# Patient Record
Sex: Male | Born: 1965
Health system: Southern US, Community
[De-identification: ages and names within clinical notes are randomized; demographics above are authoritative.]

## PROBLEM LIST (undated history)

## (undated) DIAGNOSIS — G4700 Insomnia, unspecified: Secondary | ICD-10-CM

## (undated) DIAGNOSIS — F32A Depression, unspecified: Secondary | ICD-10-CM

## (undated) DIAGNOSIS — N189 Chronic kidney disease, unspecified: Secondary | ICD-10-CM

## (undated) DIAGNOSIS — J449 Chronic obstructive pulmonary disease, unspecified: Secondary | ICD-10-CM

## (undated) DIAGNOSIS — I2510 Atherosclerotic heart disease of native coronary artery without angina pectoris: Secondary | ICD-10-CM

## (undated) DIAGNOSIS — F419 Anxiety disorder, unspecified: Secondary | ICD-10-CM

## (undated) DIAGNOSIS — F329 Major depressive disorder, single episode, unspecified: Secondary | ICD-10-CM

## (undated) DIAGNOSIS — E7800 Pure hypercholesterolemia, unspecified: Secondary | ICD-10-CM

## (undated) DIAGNOSIS — B356 Tinea cruris: Secondary | ICD-10-CM

## (undated) HISTORY — DX: Pure hypercholesterolemia, unspecified: E78.00

## (undated) HISTORY — DX: Insomnia, unspecified: G47.00

## (undated) HISTORY — DX: Tinea cruris: B35.6

## (undated) HISTORY — DX: Atherosclerotic heart disease of native coronary artery without angina pectoris: I25.10

## (undated) HISTORY — PX: REPAIR ANKLE LIGAMENT: SUR1187

## (undated) HISTORY — DX: Chronic obstructive pulmonary disease, unspecified: J44.9

## (undated) HISTORY — PX: INCISION AND DRAINAGE ABSCESS / HEMATOMA OF BURSA / KNEE / THIGH: SUR668

## (undated) HISTORY — DX: Chronic kidney disease, unspecified: N18.9

---

## 2008-04-19 ENCOUNTER — Encounter (INDEPENDENT_AMBULATORY_CARE_PROVIDER_SITE_OTHER): Payer: Self-pay | Admitting: General Surgery

## 2008-04-19 ENCOUNTER — Other Ambulatory Visit: Admission: RE | Admit: 2008-04-19 | Discharge: 2008-04-19 | Payer: Self-pay | Admitting: General Surgery

## 2009-08-14 ENCOUNTER — Ambulatory Visit (HOSPITAL_COMMUNITY): Admission: RE | Admit: 2009-08-14 | Discharge: 2009-08-14 | Payer: Self-pay | Admitting: Family Medicine

## 2011-04-03 ENCOUNTER — Other Ambulatory Visit (HOSPITAL_COMMUNITY): Payer: Self-pay | Admitting: Family Medicine

## 2011-04-03 ENCOUNTER — Ambulatory Visit (HOSPITAL_COMMUNITY)
Admission: RE | Admit: 2011-04-03 | Discharge: 2011-04-03 | Disposition: A | Discharge status: Home / Self Care | Payer: PRIVATE HEALTH INSURANCE | Source: Ambulatory Visit | Attending: Family Medicine | Admitting: Family Medicine

## 2011-04-03 DIAGNOSIS — F17200 Nicotine dependence, unspecified, uncomplicated: Secondary | ICD-10-CM | POA: Insufficient documentation

## 2011-04-03 DIAGNOSIS — R059 Cough, unspecified: Secondary | ICD-10-CM | POA: Insufficient documentation

## 2011-04-03 DIAGNOSIS — R05 Cough: Secondary | ICD-10-CM | POA: Insufficient documentation

## 2011-04-03 DIAGNOSIS — J438 Other emphysema: Secondary | ICD-10-CM | POA: Insufficient documentation

## 2012-02-19 ENCOUNTER — Other Ambulatory Visit (HOSPITAL_COMMUNITY): Payer: Self-pay | Admitting: Family Medicine

## 2012-02-19 ENCOUNTER — Ambulatory Visit (HOSPITAL_COMMUNITY)
Admission: RE | Admit: 2012-02-19 | Discharge: 2012-02-19 | Disposition: A | Discharge status: Home / Self Care | Payer: Commercial Managed Care - PPO | Source: Ambulatory Visit | Attending: Family Medicine | Admitting: Family Medicine

## 2012-02-19 DIAGNOSIS — M5030 Other cervical disc degeneration, unspecified cervical region: Secondary | ICD-10-CM | POA: Insufficient documentation

## 2012-02-19 DIAGNOSIS — M542 Cervicalgia: Secondary | ICD-10-CM | POA: Insufficient documentation

## 2012-02-19 DIAGNOSIS — R52 Pain, unspecified: Secondary | ICD-10-CM

## 2014-02-27 ENCOUNTER — Other Ambulatory Visit (HOSPITAL_COMMUNITY): Payer: Self-pay | Admitting: Family Medicine

## 2014-02-27 ENCOUNTER — Ambulatory Visit (HOSPITAL_COMMUNITY)
Admission: RE | Admit: 2014-02-27 | Discharge: 2014-02-27 | Disposition: A | Discharge status: Home / Self Care | Payer: Commercial Managed Care - PPO | Source: Ambulatory Visit | Attending: Family Medicine | Admitting: Family Medicine

## 2014-02-27 DIAGNOSIS — M545 Low back pain, unspecified: Secondary | ICD-10-CM

## 2014-02-27 DIAGNOSIS — M542 Cervicalgia: Secondary | ICD-10-CM | POA: Diagnosis present

## 2014-02-27 DIAGNOSIS — M5030 Other cervical disc degeneration, unspecified cervical region: Secondary | ICD-10-CM | POA: Insufficient documentation

## 2014-03-09 ENCOUNTER — Other Ambulatory Visit (HOSPITAL_COMMUNITY): Payer: Self-pay | Admitting: Family Medicine

## 2014-03-09 DIAGNOSIS — M542 Cervicalgia: Secondary | ICD-10-CM

## 2014-03-16 ENCOUNTER — Ambulatory Visit (HOSPITAL_COMMUNITY)
Admission: RE | Admit: 2014-03-16 | Discharge: 2014-03-16 | Disposition: A | Discharge status: Home / Self Care | Payer: Commercial Managed Care - PPO | Source: Ambulatory Visit | Attending: Family Medicine | Admitting: Family Medicine

## 2014-03-16 ENCOUNTER — Other Ambulatory Visit (HOSPITAL_COMMUNITY): Payer: Self-pay | Admitting: Family Medicine

## 2014-03-16 DIAGNOSIS — M542 Cervicalgia: Secondary | ICD-10-CM

## 2014-03-16 DIAGNOSIS — M79602 Pain in left arm: Secondary | ICD-10-CM

## 2014-03-16 DIAGNOSIS — M5020 Other cervical disc displacement, unspecified cervical region: Secondary | ICD-10-CM | POA: Insufficient documentation

## 2014-03-16 DIAGNOSIS — M47812 Spondylosis without myelopathy or radiculopathy, cervical region: Secondary | ICD-10-CM | POA: Insufficient documentation

## 2014-03-16 DIAGNOSIS — M4802 Spinal stenosis, cervical region: Secondary | ICD-10-CM | POA: Insufficient documentation

## 2014-03-30 ENCOUNTER — Other Ambulatory Visit: Payer: Self-pay | Admitting: Family Medicine

## 2014-03-30 DIAGNOSIS — M542 Cervicalgia: Secondary | ICD-10-CM

## 2014-04-03 ENCOUNTER — Ambulatory Visit
Admission: RE | Admit: 2014-04-03 | Discharge: 2014-04-03 | Disposition: A | Discharge status: Home / Self Care | Payer: Commercial Managed Care - PPO | Source: Ambulatory Visit | Attending: Family Medicine | Admitting: Family Medicine

## 2014-04-03 VITALS — BP 137/82 | HR 83

## 2014-04-03 DIAGNOSIS — M5020 Other cervical disc displacement, unspecified cervical region: Secondary | ICD-10-CM

## 2014-04-03 DIAGNOSIS — M542 Cervicalgia: Secondary | ICD-10-CM

## 2014-04-03 MED ORDER — TRIAMCINOLONE ACETONIDE 40 MG/ML IJ SUSP (RADIOLOGY)
60.0000 mg | Freq: Once | INTRAMUSCULAR | Status: AC
  Administered 2014-04-03: 60 mg via EPIDURAL

## 2014-04-03 MED ORDER — IOHEXOL 300 MG/ML  SOLN
1.0000 mL | Freq: Once | INTRAMUSCULAR | Status: AC | PRN
  Administered 2014-04-03: 1 mL via EPIDURAL

## 2014-04-03 NOTE — Discharge Instructions (Signed)

## 2014-08-16 ENCOUNTER — Other Ambulatory Visit: Payer: Self-pay | Admitting: Family Medicine

## 2014-08-16 DIAGNOSIS — M4802 Spinal stenosis, cervical region: Secondary | ICD-10-CM

## 2014-08-30 ENCOUNTER — Ambulatory Visit
Admission: RE | Admit: 2014-08-30 | Discharge: 2014-08-30 | Disposition: A | Discharge status: Home / Self Care | Payer: Commercial Managed Care - PPO | Source: Ambulatory Visit | Attending: Family Medicine | Admitting: Family Medicine

## 2014-08-30 DIAGNOSIS — M4802 Spinal stenosis, cervical region: Secondary | ICD-10-CM

## 2014-08-30 MED ORDER — IOHEXOL 300 MG/ML  SOLN
1.0000 mL | Freq: Once | INTRAMUSCULAR | Status: AC | PRN
  Administered 2014-08-30: 1 mL via EPIDURAL

## 2014-08-30 MED ORDER — TRIAMCINOLONE ACETONIDE 40 MG/ML IJ SUSP (RADIOLOGY)
60.0000 mg | Freq: Once | INTRAMUSCULAR | Status: AC
  Administered 2014-08-30: 60 mg via EPIDURAL

## 2015-01-15 ENCOUNTER — Other Ambulatory Visit: Payer: Self-pay | Admitting: Family Medicine

## 2015-01-15 DIAGNOSIS — M4802 Spinal stenosis, cervical region: Secondary | ICD-10-CM

## 2015-01-23 ENCOUNTER — Ambulatory Visit
Admission: RE | Admit: 2015-01-23 | Discharge: 2015-01-23 | Disposition: A | Discharge status: Home / Self Care | Payer: Commercial Managed Care - PPO | Source: Ambulatory Visit | Attending: Family Medicine | Admitting: Family Medicine

## 2015-01-23 DIAGNOSIS — M4802 Spinal stenosis, cervical region: Secondary | ICD-10-CM

## 2015-01-23 MED ORDER — TRIAMCINOLONE ACETONIDE 40 MG/ML IJ SUSP (RADIOLOGY)
60.0000 mg | Freq: Once | INTRAMUSCULAR | Status: AC
  Administered 2015-01-23: 60 mg via EPIDURAL

## 2015-01-23 MED ORDER — IOHEXOL 300 MG/ML  SOLN
1.0000 mL | Freq: Once | INTRAMUSCULAR | Status: AC | PRN
  Administered 2015-01-23: 1 mL via EPIDURAL

## 2015-01-23 NOTE — Discharge Instructions (Signed)

## 2015-03-04 ENCOUNTER — Other Ambulatory Visit (HOSPITAL_COMMUNITY): Payer: Self-pay | Admitting: Respiratory Therapy

## 2015-03-04 DIAGNOSIS — J441 Chronic obstructive pulmonary disease with (acute) exacerbation: Secondary | ICD-10-CM

## 2015-03-06 ENCOUNTER — Ambulatory Visit (HOSPITAL_COMMUNITY): Admission: RE | Admit: 2015-03-06 | Payer: Commercial Managed Care - PPO | Source: Ambulatory Visit

## 2015-03-13 ENCOUNTER — Other Ambulatory Visit (HOSPITAL_COMMUNITY): Payer: Self-pay | Admitting: Respiratory Therapy

## 2015-03-18 ENCOUNTER — Other Ambulatory Visit: Payer: Self-pay | Admitting: Neurosurgery

## 2015-03-18 DIAGNOSIS — M5416 Radiculopathy, lumbar region: Secondary | ICD-10-CM

## 2015-03-18 DIAGNOSIS — M5412 Radiculopathy, cervical region: Secondary | ICD-10-CM

## 2015-04-04 ENCOUNTER — Ambulatory Visit
Admission: RE | Admit: 2015-04-04 | Discharge: 2015-04-04 | Disposition: A | Discharge status: Home / Self Care | Payer: Commercial Managed Care - PPO | Source: Ambulatory Visit | Attending: Neurosurgery | Admitting: Neurosurgery

## 2015-04-04 VITALS — BP 101/53 | HR 84

## 2015-04-04 DIAGNOSIS — M5416 Radiculopathy, lumbar region: Secondary | ICD-10-CM

## 2015-04-04 DIAGNOSIS — M5412 Radiculopathy, cervical region: Secondary | ICD-10-CM

## 2015-04-04 MED ORDER — DIAZEPAM 5 MG PO TABS
10.0000 mg | ORAL_TABLET | Freq: Once | ORAL | Status: AC
Start: 2015-04-04 — End: 2015-04-04
  Administered 2015-04-04: 10 mg via ORAL

## 2015-04-04 MED ORDER — IOHEXOL 300 MG/ML  SOLN
10.0000 mL | Freq: Once | INTRAMUSCULAR | Status: DC | PRN
  Administered 2015-04-04: 10 mL via INTRATHECAL

## 2015-04-04 NOTE — Discharge Instructions (Signed)

## 2015-04-05 ENCOUNTER — Ambulatory Visit (HOSPITAL_COMMUNITY)
Admission: RE | Admit: 2015-04-05 | Discharge: 2015-04-05 | Disposition: A | Discharge status: Home / Self Care | Payer: Commercial Managed Care - PPO | Source: Ambulatory Visit | Attending: Internal Medicine | Admitting: Internal Medicine

## 2015-04-05 DIAGNOSIS — J441 Chronic obstructive pulmonary disease with (acute) exacerbation: Secondary | ICD-10-CM | POA: Insufficient documentation

## 2015-04-05 MED ORDER — ALBUTEROL SULFATE (2.5 MG/3ML) 0.083% IN NEBU
2.5000 mg | INHALATION_SOLUTION | Freq: Once | RESPIRATORY_TRACT | Status: AC
  Administered 2015-04-05: 2.5 mg via RESPIRATORY_TRACT

## 2015-04-10 LAB — PULMONARY FUNCTION TEST
DL/VA % PRED: 88 %
DL/VA: 4.22 ml/min/mmHg/L
DLCO unc % pred: 70 %
DLCO unc: 24.59 ml/min/mmHg
FEF 25-75 POST: 2.38 L/s
FEF 25-75 Pre: 1.45 L/sec
FEF2575-%CHANGE-POST: 63 %
FEF2575-%PRED-POST: 65 %
FEF2575-%PRED-PRE: 40 %
FEV1-%CHANGE-POST: 15 %
FEV1-%PRED-PRE: 56 %
FEV1-%Pred-Post: 64 %
FEV1-PRE: 2.34 L
FEV1-Post: 2.7 L
FEV1FVC-%CHANGE-POST: 0 %
FEV1FVC-%PRED-PRE: 87 %
FEV6-%Change-Post: 14 %
FEV6-%Pred-Post: 75 %
FEV6-%Pred-Pre: 65 %
FEV6-POST: 3.9 L
FEV6-Pre: 3.41 L
FEV6FVC-%Change-Post: 0 %
FEV6FVC-%PRED-POST: 103 %
FEV6FVC-%Pred-Pre: 103 %
FVC-%CHANGE-POST: 14 %
FVC-%PRED-POST: 73 %
FVC-%PRED-PRE: 63 %
FVC-POST: 3.92 L
FVC-PRE: 3.43 L
PRE FEV6/FVC RATIO: 100 %
Post FEV1/FVC ratio: 69 %
Post FEV6/FVC ratio: 99 %
Pre FEV1/FVC ratio: 68 %
RV % pred: 213 %
RV: 4.64 L
TLC % PRED: 111 %
TLC: 8.23 L

## 2015-10-02 ENCOUNTER — Telehealth: Payer: Self-pay

## 2015-10-02 NOTE — Telephone Encounter (Signed)
[phone]  PATIENT RECEIVED LETTER TO SCHEDULE TCS

## 2015-10-07 NOTE — Telephone Encounter (Signed)
I called pt and he said he has some spotting of blood on tissue sometimes and he also has intermittent rectal pain which gets very uncomfortable.  Ov with Wynne DustEric Gill, NP on 10/24/2015 at 10:30 AM.

## 2015-10-24 ENCOUNTER — Encounter: Payer: Self-pay | Admitting: Nurse Practitioner

## 2015-10-24 ENCOUNTER — Ambulatory Visit (INDEPENDENT_AMBULATORY_CARE_PROVIDER_SITE_OTHER): Payer: Commercial Managed Care - PPO | Admitting: Nurse Practitioner

## 2015-10-24 ENCOUNTER — Other Ambulatory Visit: Payer: Self-pay

## 2015-10-24 VITALS — BP 123/80 | HR 82 | Temp 97.9°F | Ht 66.0 in | Wt 210.2 lb

## 2015-10-24 DIAGNOSIS — K6289 Other specified diseases of anus and rectum: Secondary | ICD-10-CM | POA: Diagnosis not present

## 2015-10-24 DIAGNOSIS — Z1211 Encounter for screening for malignant neoplasm of colon: Secondary | ICD-10-CM | POA: Diagnosis not present

## 2015-10-24 DIAGNOSIS — K625 Hemorrhage of anus and rectum: Secondary | ICD-10-CM

## 2015-10-24 MED ORDER — PEG 3350-KCL-NA BICARB-NACL 420 G PO SOLR: 4000.0000 mL | Freq: Once | ORAL | Status: DC

## 2015-10-24 NOTE — Assessment & Plan Note (Signed)
Patient with rectal burning and irritation associated with scant toilet tissue hematochezia as per above. Tried his jock itch steroid cream one time which helped. At this point I will send an Anusol to the pharmacy for him to use as needed for what is likely hemorrhoids. First-ever screening colonoscopy as noted below.

## 2015-10-24 NOTE — Progress Notes (Signed)
cc'ed to pcp °

## 2015-10-24 NOTE — Patient Instructions (Signed)
1. We will schedule your procedure for you. 2. We will use stronger sleepy medicine to ensure you're comfortable during the procedure. 3. Return for follow-up based on postprocedure recommendations.
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
## 2015-10-24 NOTE — Assessment & Plan Note (Addendum)
Patient is due for first-ever colonoscopy which will also allow evaluation of cause of rectal bleeding and pain as noted above. Likely due to hemorrhoids, may be a candidate for hemorrhoid banding. At this point he is wanting to try medication for his hemorrhoids as irregular problem. We will proceed with his colonoscopy at this time.  Proceed with colonoscopy in the OR on propofol/MAC with Dr. Darrick PennaFields in the near future. The risks, benefits, and alternatives have been discussed in detail with the patient. They state understanding and desire to proceed.   Patient is currently on hydrocodone as needed, Flexeril, Xanax, Celebrex. This point we will proceed with the procedure and the OR on propofol/MAC to promote adequate sedation.

## 2015-10-24 NOTE — Assessment & Plan Note (Signed)
Patient with rare scant toilet tissue hematochezia associated with rectal irritation and burning. Likely due to hemorrhoids. Has not had an issue in several months. Continue to monitor, set up first-ever screening colonoscopy as he is currently due, as per below.

## 2015-10-28 ENCOUNTER — Telehealth: Payer: Self-pay

## 2015-10-28 DIAGNOSIS — K649 Unspecified hemorrhoids: Secondary | ICD-10-CM

## 2015-10-28 MED ORDER — HYDROCORTISONE 2.5 % RE CREA: 1.0000 "application " | TOPICAL_CREAM | Freq: Two times a day (BID) | RECTAL | Status: DC

## 2015-10-28 NOTE — Telephone Encounter (Signed)
It appears I forgot. My apologies. I sent it in now. Anusol rectal cream, twice daily for up to 10 days at a time.  Please notify the patient.

## 2015-10-28 NOTE — Telephone Encounter (Signed)
Pt called to see if EG had called in the pain medication that he said at the office visit. Please advise

## 2015-10-28 NOTE — Telephone Encounter (Signed)
PT IS AWARE

## 2015-11-20 NOTE — Patient Instructions (Signed)
Bobby Gonzales  11/20/2015     @   Your procedure is scheduled on 11/26/2015.  Report to [name] at 8:30 A.M.  Call this number if you have problems the morning of surgery:  [phone]   Remember:  Do not eat food or drink liquids after midnight.  Take these medicines the morning of surgery with A SIP OF WATER XANAX, FLEXERIL, VICODIN AND PRILOSEC.  PLEASE TAKE YOUR ADVAIR AND ANORO ELLIPA BEFORE LEAVING HOME AND BRING IT WITH YOU TO THE HOSPITAL.     Do not wear jewelry, make-up or nail polish.  Do not wear lotions, powders, or perfumes.  You may wear deodorant.  Do not shave 48 hours prior to surgery.  Men may shave face and neck.  Do not bring valuables to the hospital.  [facility] is not responsible for any belongings or valuables.  Contacts, dentures or bridgework may not be worn into surgery.  Leave your suitcase in the car.  After surgery it may be brought to your room.  For patients admitted to the hospital, discharge time will be determined by your treatment team.  Patients discharged the day of surgery will not be allowed to drive home.   Name and phone number of your driver:   FAMILY Special instructions:  N/A  Please read over the following fact sheets that you were given. Care and Recovery After Surgery   PATIENT INSTRUCTIONS POST-ANESTHESIA  IMMEDIATELY FOLLOWING SURGERY:  Do not drive or operate machinery for the first twenty four hours after surgery.  Do not make any important decisions for twenty four hours after surgery or while taking narcotic pain medications or sedatives.  If you develop intractable nausea and vomiting or a severe headache please notify your doctor immediately.  FOLLOW-UP:  Please make an appointment with your surgeon as instructed. You do not need to follow up with anesthesia unless specifically instructed to do so.  WOUND CARE INSTRUCTIONS (if applicable):  Keep a dry clean dressing on the anesthesia/puncture  wound site if there is drainage.  Once the wound has quit draining you may leave it open to air.  Generally you should leave the bandage intact for twenty four hours unless there is drainage.  If the epidural site drains for more than 36-48 hours please call the anesthesia department.  QUESTIONS?:  Please feel free to call your physician or the hospital operator if you have any questions, and they will be happy to assist you.      Colonoscopy A colonoscopy is an exam to look at the entire large intestine (colon). This exam can help find problems such as tumors, polyps, inflammation, and areas of bleeding. The exam takes about 1 hour.  LET [facility] CARE PROVIDER KNOW ABOUT:   Any allergies you have.  All medicines you are taking, including vitamins, herbs, eye drops, creams, and over-the-counter medicines.  Previous problems you or members of your family have had with the use of anesthetics.  Any blood disorders you have.  Previous surgeries you have had.  Medical conditions you have. RISKS AND COMPLICATIONS  Generally, this is a safe procedure. However, as with any procedure, complications can occur. Possible complications include:  Bleeding.  Tearing or rupture of the colon wall.  Reaction to medicines given during the exam.  Infection (rare). BEFORE THE PROCEDURE   Ask your health care provider about changing or stopping your regular medicines.  You may be prescribed an oral bowel prep. This involves drinking a  large amount of medicated liquid, starting the day before your procedure. The liquid will cause you to have multiple loose stools until your stool is almost clear or light green. This cleans out your colon in preparation for the procedure.  Do not eat or drink anything else once you have started the bowel prep, unless your health care provider tells you it is safe to do so.  Arrange for someone to drive you home after the procedure. PROCEDURE   You will be given  medicine to help you relax (sedative).  You will lie on your side with your knees bent.  A long, flexible tube with a light and camera on the end (colonoscope) will be inserted through the rectum and into the colon. The camera sends video back to a computer screen as it moves through the colon. The colonoscope also releases carbon dioxide gas to inflate the colon. This helps your health care provider see the area better.  During the exam, your health care provider may take a small tissue sample (biopsy) to be examined under a microscope if any abnormalities are found.  The exam is finished when the entire colon has been viewed. AFTER THE PROCEDURE   Do not drive for 24 hours after the exam.  You may have a small amount of blood in your stool.  You may pass moderate amounts of gas and have mild abdominal cramping or bloating. This is caused by the gas used to inflate your colon during the exam.  Ask when your test results will be ready and how you will get your results. Make sure you get your test results.   This information is not intended to replace advice given to you by your health care provider. Make sure you discuss any questions you have with your health care provider.   Document Released: 06/19/2000 Document Revised: 04/12/2013 Document Reviewed: 02/27/2013 Elsevier Interactive Patient Education [employer]2016 Elsevier Inc.

## 2015-11-22 ENCOUNTER — Encounter (HOSPITAL_COMMUNITY)
Admission: RE | Admit: 2015-11-22 | Discharge: 2015-11-22 | Disposition: A | Discharge status: Home / Self Care | Payer: Commercial Managed Care - PPO | Source: Ambulatory Visit | Attending: Gastroenterology | Admitting: Gastroenterology

## 2015-11-22 ENCOUNTER — Encounter (HOSPITAL_COMMUNITY): Payer: Self-pay

## 2015-11-22 ENCOUNTER — Other Ambulatory Visit: Payer: Self-pay

## 2015-11-22 DIAGNOSIS — Z01818 Encounter for other preprocedural examination: Secondary | ICD-10-CM | POA: Insufficient documentation

## 2015-11-22 DIAGNOSIS — Z01812 Encounter for preprocedural laboratory examination: Secondary | ICD-10-CM | POA: Insufficient documentation

## 2015-11-22 HISTORY — DX: Major depressive disorder, single episode, unspecified: F32.9

## 2015-11-22 HISTORY — DX: Anxiety disorder, unspecified: F41.9

## 2015-11-22 HISTORY — DX: Depression, unspecified: F32.A

## 2015-11-22 LAB — CBC WITH DIFFERENTIAL/PLATELET
BASOS ABS: 0 10*3/uL (ref 0.0–0.1)
Basophils Relative: 0 %
EOS ABS: 0.4 10*3/uL (ref 0.0–0.7)
EOS PCT: 5 %
HCT: 44.4 % (ref 39.0–52.0)
Hemoglobin: 15 g/dL (ref 13.0–17.0)
Lymphocytes Relative: 23 %
Lymphs Abs: 2 10*3/uL (ref 0.7–4.0)
MCH: 28.9 pg (ref 26.0–34.0)
MCHC: 33.8 g/dL (ref 30.0–36.0)
MCV: 85.5 fL (ref 78.0–100.0)
Monocytes Absolute: 0.7 10*3/uL (ref 0.1–1.0)
Monocytes Relative: 8 %
Neutro Abs: 5.7 10*3/uL (ref 1.7–7.7)
Neutrophils Relative %: 64 %
PLATELETS: 204 10*3/uL (ref 150–400)
RBC: 5.19 MIL/uL (ref 4.22–5.81)
RDW: 13.9 % (ref 11.5–15.5)
WBC: 8.9 10*3/uL (ref 4.0–10.5)

## 2015-11-22 LAB — BASIC METABOLIC PANEL
ANION GAP: 7 (ref 5–15)
BUN: 15 mg/dL (ref 6–20)
CALCIUM: 9.2 mg/dL (ref 8.9–10.3)
CO2: 25 mmol/L (ref 22–32)
Chloride: 102 mmol/L (ref 101–111)
Creatinine, Ser: 0.98 mg/dL (ref 0.61–1.24)
GFR calc Af Amer: >60 mL/min (ref >60)
Glucose, Bld: 87 mg/dL (ref 65–99)
POTASSIUM: 4.5 mmol/L (ref 3.5–5.1)
SODIUM: 134 mmol/L — AB (ref 135–145)

## 2015-11-22 LAB — SURGICAL PCR SCREEN
MRSA, PCR: INVALID — AB
Staphylococcus aureus: INVALID — AB

## 2015-11-24 LAB — MRSA CULTURE

## 2015-11-26 ENCOUNTER — Ambulatory Visit (HOSPITAL_COMMUNITY): Payer: Commercial Managed Care - PPO | Admitting: Anesthesiology

## 2015-11-26 ENCOUNTER — Telehealth: Payer: Self-pay | Admitting: Gastroenterology

## 2015-11-26 ENCOUNTER — Encounter (HOSPITAL_COMMUNITY): Payer: Self-pay | Admitting: *Deleted

## 2015-11-26 ENCOUNTER — Ambulatory Visit (HOSPITAL_COMMUNITY)
Admission: RE | Admit: 2015-11-26 | Discharge: 2015-11-26 | Disposition: A | Discharge status: Home / Self Care | Payer: Commercial Managed Care - PPO | Source: Ambulatory Visit | Attending: Gastroenterology | Admitting: Gastroenterology

## 2015-11-26 ENCOUNTER — Encounter (HOSPITAL_COMMUNITY)
Admission: RE | Disposition: A | Discharge status: Home / Self Care | Payer: Self-pay | Source: Ambulatory Visit | Attending: Gastroenterology

## 2015-11-26 DIAGNOSIS — Z79899 Other long term (current) drug therapy: Secondary | ICD-10-CM | POA: Diagnosis not present

## 2015-11-26 DIAGNOSIS — E7800 Pure hypercholesterolemia, unspecified: Secondary | ICD-10-CM | POA: Insufficient documentation

## 2015-11-26 DIAGNOSIS — Z1211 Encounter for screening for malignant neoplasm of colon: Secondary | ICD-10-CM | POA: Diagnosis not present

## 2015-11-26 DIAGNOSIS — F17210 Nicotine dependence, cigarettes, uncomplicated: Secondary | ICD-10-CM | POA: Insufficient documentation

## 2015-11-26 DIAGNOSIS — K635 Polyp of colon: Secondary | ICD-10-CM

## 2015-11-26 DIAGNOSIS — J449 Chronic obstructive pulmonary disease, unspecified: Secondary | ICD-10-CM | POA: Diagnosis not present

## 2015-11-26 DIAGNOSIS — K648 Other hemorrhoids: Secondary | ICD-10-CM | POA: Diagnosis not present

## 2015-11-26 DIAGNOSIS — F418 Other specified anxiety disorders: Secondary | ICD-10-CM | POA: Insufficient documentation

## 2015-11-26 DIAGNOSIS — K644 Residual hemorrhoidal skin tags: Secondary | ICD-10-CM | POA: Diagnosis not present

## 2015-11-26 HISTORY — PX: COLONOSCOPY WITH PROPOFOL: SHX5780

## 2015-11-26 SURGERY — COLONOSCOPY WITH PROPOFOL
Anesthesia: Monitor Anesthesia Care

## 2015-11-26 MED ORDER — FENTANYL CITRATE (PF) 100 MCG/2ML IJ SOLN: 25.0000 ug | INTRAMUSCULAR | Status: DC | PRN

## 2015-11-26 MED ORDER — ONDANSETRON HCL 4 MG/2ML IJ SOLN: 4.0000 mg | Freq: Once | INTRAMUSCULAR | Status: DC | PRN

## 2015-11-26 MED ORDER — PROPOFOL 500 MG/50ML IV EMUL
INTRAVENOUS | Status: DC | PRN
Start: 2015-11-26 — End: 2015-11-26
  Administered 2015-11-26: 10:00:00 via INTRAVENOUS
  Administered 2015-11-26: 125 ug/kg/min via INTRAVENOUS

## 2015-11-26 MED ORDER — MIDAZOLAM HCL 5 MG/5ML IJ SOLN
INTRAMUSCULAR | Status: DC | PRN
  Administered 2015-11-26: 2 mg via INTRAVENOUS

## 2015-11-26 MED ORDER — FENTANYL CITRATE (PF) 100 MCG/2ML IJ SOLN
INTRAMUSCULAR | Status: AC
  Filled 2015-11-26: qty 2

## 2015-11-26 MED ORDER — MIDAZOLAM HCL 2 MG/2ML IJ SOLN
INTRAMUSCULAR | Status: AC
  Filled 2015-11-26: qty 2

## 2015-11-26 MED ORDER — MIDAZOLAM HCL 2 MG/2ML IJ SOLN
1.0000 mg | INTRAMUSCULAR | Status: DC | PRN
  Administered 2015-11-26 (×2): 2 mg via INTRAVENOUS
  Filled 2015-11-26: qty 2

## 2015-11-26 MED ORDER — LACTATED RINGERS IV SOLN
INTRAVENOUS | Status: DC
  Administered 2015-11-26: 09:00:00 via INTRAVENOUS

## 2015-11-26 MED ORDER — FENTANYL CITRATE (PF) 100 MCG/2ML IJ SOLN
25.0000 ug | INTRAMUSCULAR | Status: AC
  Administered 2015-11-26 (×2): 25 ug via INTRAVENOUS

## 2015-11-26 MED ORDER — PROPOFOL 10 MG/ML IV BOLUS
INTRAVENOUS | Status: AC
  Filled 2015-11-26: qty 20

## 2015-11-26 NOTE — Telephone Encounter (Signed)
[facility] will send referral for hemorrhoid surgery today. Make your appt in June. Have surgery JULY or August.

## 2015-11-26 NOTE — Anesthesia Postprocedure Evaluation (Signed)
Anesthesia Post Note  Patient: Bobby RosebushRussell Derner  Procedure(s) Performed: Procedure(s) (LRB): COLONOSCOPY WITH PROPOFOL (N/A)  Patient location during evaluation: PACU Anesthesia Type: MAC Level of consciousness: awake and patient cooperative Pain management: pain level controlled Vital Signs Assessment: post-procedure vital signs reviewed and stable Respiratory status: spontaneous breathing, nonlabored ventilation and respiratory function stable Cardiovascular status: blood pressure returned to baseline Postop Assessment: no signs of nausea or vomiting Anesthetic complications: no    Last Vitals:  Filed Vitals:   11/26/15 0935 11/26/15 0940  BP: 132/84 126/80  Pulse:    Temp:    Resp: 31 22    Last Pain:  Filed Vitals:   11/26/15 1000  PainSc: 8                  Sue Mcalexander J

## 2015-11-26 NOTE — Discharge Instructions (Signed)
You had ONE polyp removed. You have SMALL internal hemorrhoids AND LARGE EXTERNAL HEMORHOIDS.   DRINK WATER TO KEEP YOUR URINE LIGHT YELLOW.  FOLLOW A HIGH FIBER DIET. AVOID ITEMS THAT CAUSE BLOATING & GAS. SEE INFO BELOW.  USE PREPARATION H CREAM OR ANUSOL CREAM FOUR TIMES  A DAY IF NEEDED TO RELIEVE RECTAL PAIN/PRESSURE/BLEEDING. YOU SHOULD NOT USE ANUSOL CREAM MORE THAN 2 WEEKS AT A TIME AND NO MORE THAN 3 OR 4 TIMES A YEAR.  YOUR BIOPSY RESULTS WILL BE AVAILABLE IN MY CHART MAY 26 AND MY OFFICE WILL CONTACT YOU IN 10-14 DAYS WITH YOUR RESULTS.   I will send referral for hemorrhoid surgery today. Make your appt in June. Have surgery JULY or August.  Next colonoscopy in 5-10 years.    Colonoscopy Care After Read the instructions outlined below and refer to this sheet in the next week. These discharge instructions provide you with general information on caring for yourself after you leave the hospital. While your treatment has been planned according to the most current medical practices available, unavoidable complications occasionally occur. If you have any problems or questions after discharge, call DR. Capricia Serda, [phone].  ACTIVITY  You may resume your regular activity, but move at a slower pace for the next 24 hours.   Take frequent rest periods for the next 24 hours.   Walking will help get rid of the air and reduce the bloated feeling in your belly (abdomen).   No driving for 24 hours (because of the medicine (anesthesia) used during the test).   You may shower.   Do not sign any important legal documents or operate any machinery for 24 hours (because of the anesthesia used during the test).    NUTRITION  Drink plenty of fluids.   You may resume your normal diet as instructed by your doctor.   Begin with a light meal and progress to your normal diet. Heavy or fried foods are harder to digest and may make you feel sick to your stomach (nauseated).   Avoid alcoholic  beverages for 24 hours or as instructed.    MEDICATIONS  You may resume your normal medications.   WHAT YOU CAN EXPECT TODAY  Some feelings of bloating in the abdomen.   Passage of more gas than usual.   Spotting of blood in your stool or on the toilet paper  .  IF YOU HAD POLYPS REMOVED DURING THE COLONOSCOPY:  Eat a soft diet IF YOU HAVE NAUSEA, BLOATING, ABDOMINAL PAIN, OR VOMITING.    FINDING OUT THE RESULTS OF YOUR TEST Not all test results are available during your visit. DR. Darrick PennaFIELDS WILL CALL YOU WITHIN 14 DAYS OF YOUR PROCEDUE WITH YOUR RESULTS. Do not assume everything is normal if you have not heard from DR. Juanmiguel Defelice, CALL HER OFFICE AT [phone].  SEEK IMMEDIATE MEDICAL ATTENTION AND CALL THE OFFICE: [phone] IF:  You have more than a spotting of blood in your stool.   Your belly is swollen (abdominal distention).   You are nauseated or vomiting.   You have a temperature over 101F.   You have abdominal pain or discomfort that is severe or gets worse throughout the day.   High-Fiber Diet A high-fiber diet changes your normal diet to include more whole grains, legumes, fruits, and vegetables. Changes in the diet involve replacing refined carbohydrates with unrefined foods. The calorie level of the diet is essentially unchanged. The Dietary Reference Intake (recommended amount) for adult males is 38 grams per  day. For adult females, it is 25 grams per day. Pregnant and lactating women should consume 28 grams of fiber per day. Fiber is the intact part of a plant that is not broken down during digestion. Functional fiber is fiber that has been isolated from the plant to provide a beneficial effect in the body. PURPOSE  Increase stool bulk.   Ease and regulate bowel movements.   Lower cholesterol.  REDUCE RISK OF COLON CANCER  INDICATIONS THAT YOU NEED MORE FIBER  Constipation and hemorrhoids.   Uncomplicated diverticulosis (intestine condition) and  irritable bowel syndrome.   Weight management.   As a protective measure against hardening of the arteries (atherosclerosis), diabetes, and cancer.   GUIDELINES FOR INCREASING FIBER IN THE DIET  Start adding fiber to the diet slowly. A gradual increase of about 5 more grams (2 slices of whole-wheat bread, 2 servings of most fruits or vegetables, or 1 bowl of high-fiber cereal) per day is best. Too rapid an increase in fiber may result in constipation, flatulence, and bloating.   Drink enough water and fluids to keep your urine clear or pale yellow. Water, juice, or caffeine-free drinks are recommended. Not drinking enough fluid may cause constipation.   Eat a variety of high-fiber foods rather than one type of fiber.   Try to increase your intake of fiber through using high-fiber foods rather than fiber pills or supplements that contain small amounts of fiber.   The goal is to change the types of food eaten. Do not supplement your present diet with high-fiber foods, but replace foods in your present diet.   INCLUDE A VARIETY OF FIBER SOURCES  Replace refined and processed grains with whole grains, canned fruits with fresh fruits, and incorporate other fiber sources. White rice, white breads, and most bakery goods contain little or no fiber.   Brown whole-grain rice, buckwheat oats, and many fruits and vegetables are all good sources of fiber. These include: broccoli, Brussels sprouts, cabbage, cauliflower, beets, sweet potatoes, white potatoes (skin on), carrots, tomatoes, eggplant, squash, berries, fresh fruits, and dried fruits.   Cereals appear to be the richest source of fiber. Cereal fiber is found in whole grains and bran. Bran is the fiber-rich outer coat of cereal grain, which is largely removed in refining. In whole-grain cereals, the bran remains. In breakfast cereals, the largest amount of fiber is found in those with "bran" in their names. The fiber content is sometimes indicated  on the label.   You may need to include additional fruits and vegetables each day.   In baking, for 1 cup white flour, you may use the following substitutions:   1 cup whole-wheat flour minus 2 tablespoons.   1/2 cup white flour plus 1/2 cup whole-wheat flour.   Polyps, Colon  A polyp is extra tissue that grows inside your body. Colon polyps grow in the large intestine. The large intestine, also called the colon, is part of your digestive system. It is a long, hollow tube at the end of your digestive tract where your body makes and stores stool. Most polyps are not dangerous. They are benign. This means they are not cancerous. But over time, some types of polyps can turn into cancer. Polyps that are smaller than a pea are usually not harmful. But larger polyps could someday become or may already be cancerous. To be safe, doctors remove all polyps and test them.    PREVENTION There is not one sure way to prevent polyps. You might  be able to lower your risk of getting them if you:  Eat more fruits and vegetables and less fatty food.   Do not smoke.   Avoid alcohol.   Exercise every day.   Lose weight if you are overweight.   Eating more calcium and folate can also lower your risk of getting polyps. Some foods that are rich in calcium are milk, cheese, and broccoli. Some foods that are rich in folate are chickpeas, kidney beans, and spinach.   Hemorrhoids Hemorrhoids are dilated (enlarged) veins around the rectum. Sometimes clots will form in the veins. This makes them swollen and painful. These are called thrombosed hemorrhoids. Causes of hemorrhoids include:  Constipation.   Straining to have a bowel movement.   HEAVY LIFTING  HOME CARE INSTRUCTIONS  Eat a well balanced diet and drink 6 to 8 glasses of water every day to avoid constipation. You may also use a bulk laxative.   Avoid straining to have bowel movements.   Keep anal area dry and clean.   Do not use a donut  shaped pillow or sit on the toilet for long periods. This increases blood pooling and pain.   Move your bowels when your body has the urge; this will require less straining and will decrease pain and pressure.

## 2015-11-26 NOTE — Progress Notes (Signed)
REVIEWED-NO ADDITIONAL RECOMMENDATIONS. 

## 2015-11-26 NOTE — Transfer of Care (Signed)
Immediate Anesthesia Transfer of Care Note  Patient: Bobby RosebushRussell Gonzales  Procedure(s) Performed: Procedure(s) with comments: COLONOSCOPY WITH PROPOFOL (N/A) - 1000  Patient Location: PACU  Anesthesia Type:MAC  Level of Consciousness: awake and patient cooperative  Airway & Oxygen Therapy: Patient Spontanous Breathing and Patient connected to face mask oxygen  Post-op Assessment: Report given to RN, Post -op Vital signs reviewed and stable and Patient moving all extremities  Post vital signs: Reviewed and stable  Last Vitals:  Filed Vitals:   11/26/15 0935 11/26/15 0940  BP: 132/84 126/80  Pulse:    Temp:    Resp: 31 22    Last Pain:  Filed Vitals:   11/26/15 1000  PainSc: 8       Patients Stated Pain Goal: 10 (11/26/15 0911)  Complications: No apparent anesthesia complications

## 2015-11-26 NOTE — Op Note (Signed)
[facility] Patient Name: Bobby Gonzales Procedure Date: 11/26/2015 9:47 AM MRN: [id number] Date of Birth: Dec 07, 1965 Attending MD: Jonette Eva , MD CSN: [id number] Age: 50 Admit Type: Outpatient Procedure:                Colonoscopy WITH COLD FORCEPS POLYPECTOMY Indications:              Screening for colorectal malignant neoplasm Providers:                Jonette Eva, MD, Brain Hilts, RN, Birder Robson,                            Technician Referring MD:             Catalina Pizza, MD Medicines:                Propofol per Anesthesia Complications:            No immediate complications. Estimated Blood Loss:     Estimated blood loss was minimal. Procedure:                Pre-Anesthesia Assessment:                           - Prior to the procedure, a History and Physical                            was performed, and patient medications and                            allergies were reviewed. The patient's tolerance of                            previous anesthesia was also reviewed. The risks                            and benefits of the procedure and the sedation                            options and risks were discussed with the patient.                            All questions were answered, and informed consent                            was obtained. Prior Anticoagulants: The patient has                            taken no previous anticoagulant or antiplatelet                            agents. ASA Grade Assessment: II - A patient with                            mild systemic disease. After reviewing the risks  and benefits, the patient was deemed in                            satisfactory condition to undergo the procedure.                           After obtaining informed consent, the colonoscope                            was passed under direct vision. Throughout the                            procedure, the patient's blood pressure, pulse,  and                            oxygen saturations were monitored continuously. The                            EC-[id number] ([id number]) scope was introduced through                            the anus and advanced to the the cecum, identified                            by appendiceal orifice and ileocecal valve. The                            colonoscopy was somewhat difficult due to a                            tortuous colon. Successful completion of the                            procedure was aided by using manual pressure. The                            patient tolerated the procedure well. The ileocecal                            valve, appendiceal orifice, and rectum were                            photographed. The quality of the bowel preparation                            was excellent. Scope In: 10:08:26 AM Scope Out: 10:26:40 AM Scope Withdrawal Time: 0 hours 14 minutes 1 second  Total Procedure Duration: 0 hours 18 minutes 14 seconds  Findings:      The digital rectal exam findings include non-thrombosed external       hemorrhoids.      A 4 mm polyp was found in the sigmoid colon. The polyp was sessile. The       polyp was removed with a cold biopsy forceps. Resection and retrieval  were complete.      Non-bleeding internal hemorrhoids were found. The hemorrhoids were small.      Non-bleeding external hemorrhoids were found. The hemorrhoids were       medium-sized. Impression:               - Non-thrombosed external hemorrhoids found on                            digital rectal exam.                           - One 4 mm polyp in the sigmoid colon, removed with                            a cold biopsy forceps. Resected and retrieved.                           - Non-bleeding internal hemorrhoids.                           - Non-bleeding external hemorrhoids. Moderate Sedation:      Per Anesthesia Care Recommendation:           - High fiber diet.                           -  Continue present medications.                           - Await pathology results.                           - Repeat colonoscopy in 5-10 years for surveillance.                           - Refer to a surgeon at the next available                            appointment.                           DRINK WATER TO KEEP YOUR URINE LIGHT YELLOW.                           FOLLOW A HIGH FIBER DIET. AVOID ITEMS THAT CAUSE                            BLOATING & GAS. SEE INFO BELOW.                           USE PREPARATION H CREAM OR ANUSOL CREAM FOUR TIMES                            A DAY IF NEEDED TO RELIEVE RECTAL                            PAIN/PRESSURE/BLEEDING.  YOU SHOULD NOT USE ANUSOL                            CREAM MORE THAN 2 WEEKS AT A TIME AND NO MORE THAN                            3 OR 4 TIMES A YEAR.                           REFERRAL for hemorrhoid surgery MADE today.                           - Patient has a contact number available for                            emergencies. The signs and symptoms of potential                            delayed complications were discussed with the                            patient. Return to normal activities tomorrow.                            Written discharge instructions were provided to the                            patient. Procedure Code(s):        --- Professional ---                           [phone], Colonoscopy, flexible; with biopsy, single                            or multiple Diagnosis Code(s):        --- Professional ---                           Z12.11, Encounter for screening for malignant                            neoplasm of colon                           D12.5, Benign neoplasm of sigmoid colon                           K64.4, Residual hemorrhoidal skin tags                           K64.8, Other hemorrhoids CPT copyright 2016 American Medical Association. All rights reserved. The codes documented in this report are  preliminary and upon coder review may  be revised to meet current compliance requirements. Jonette Eva, MD Jonette Eva, MD 11/26/2015 9:29:06 PM This report has been signed electronically. Number of Addenda: 0

## 2015-11-26 NOTE — Anesthesia Preprocedure Evaluation (Signed)
Anesthesia Evaluation  Patient identified by MRN, date of birth, ID band Patient awake    Reviewed: Allergy & Precautions, NPO status , Patient's Chart, lab work & pertinent test results  Airway Mallampati: II  TM Distance: >3 FB     Dental  (+) Edentulous Upper, Edentulous Lower   Pulmonary COPD, Current Smoker,    breath sounds clear to auscultation       Cardiovascular negative cardio ROS   Rhythm:Regular Rate:Normal     Neuro/Psych PSYCHIATRIC DISORDERS Anxiety Depression    GI/Hepatic GERD  Medicated,  Endo/Other    Renal/GU      Musculoskeletal   Abdominal   Peds  Hematology   Anesthesia Other Findings   Reproductive/Obstetrics                             Anesthesia Physical Anesthesia Plan  ASA: III  Anesthesia Plan: MAC   Post-op Pain Management:    Induction: Intravenous  Airway Management Planned: Simple Face Mask  Additional Equipment:   Intra-op Plan:   Post-operative Plan:   Informed Consent: I have reviewed the patients History and Physical, chart, labs and discussed the procedure including the risks, benefits and alternatives for the proposed anesthesia with the patient or authorized representative who has indicated his/her understanding and acceptance.     Plan Discussed with:   Anesthesia Plan Comments:         Anesthesia Quick Evaluation
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
## 2015-11-27 ENCOUNTER — Other Ambulatory Visit: Payer: Self-pay

## 2015-11-27 DIAGNOSIS — K649 Unspecified hemorrhoids: Secondary | ICD-10-CM

## 2015-11-27 NOTE — Telephone Encounter (Signed)
Referral has been made.

## 2015-11-28 ENCOUNTER — Encounter (HOSPITAL_COMMUNITY): Payer: Self-pay | Admitting: Gastroenterology

## 2015-12-06 ENCOUNTER — Ambulatory Visit (HOSPITAL_COMMUNITY)
Admission: RE | Admit: 2015-12-06 | Discharge: 2015-12-06 | Disposition: A | Discharge status: Home / Self Care | Payer: Commercial Managed Care - PPO | Source: Ambulatory Visit | Attending: Surgery | Admitting: Surgery

## 2015-12-06 ENCOUNTER — Telehealth: Payer: Self-pay | Admitting: Gastroenterology

## 2015-12-06 ENCOUNTER — Encounter (HOSPITAL_COMMUNITY)
Admission: RE | Admit: 2015-12-06 | Discharge: 2015-12-06 | Disposition: A | Discharge status: Home / Self Care | Payer: Commercial Managed Care - PPO | Source: Ambulatory Visit | Attending: Surgery | Admitting: Surgery

## 2015-12-06 DIAGNOSIS — Z01811 Encounter for preprocedural respiratory examination: Secondary | ICD-10-CM | POA: Diagnosis present

## 2015-12-06 DIAGNOSIS — J449 Chronic obstructive pulmonary disease, unspecified: Secondary | ICD-10-CM | POA: Diagnosis not present

## 2015-12-06 MED ORDER — FLEET ENEMA 7-19 GM/118ML RE ENEM: 1.0000 | ENEMA | Freq: Once | RECTAL | Status: DC

## 2015-12-06 NOTE — Patient Instructions (Signed)
Bobby Gonzales  12/06/2015     @PREFPERIOPPHARMACY @   Your procedure is scheduled on  12/09/2015   [facility]  [facility].M.  Call this number if you have problems the morning of surgery:  [phone]   Remember:  Do not eat food or drink liquids after midnight.  Take these medicines the morning of surgery with A SIP OF WATER  Xanax, celebrex, prilosec, hydrocodone. Take your inhalers before you come and bring them with you.   Do not wear jewelry, make-up or nail polish.  Do not wear lotions, powders, or perfumes.  You may wear deodorant.  Do not shave 48 hours prior to surgery.  Men may shave face and neck.  Do not bring valuables to the hospital.  [name] any belongings or valuables.  Contacts, dentures or bridgework may not be worn into surgery.  Leave your suitcase in the car.  After surgery it may be brought to your room.  For patients admitted to the hospital, discharge time will be determined by your treatment team.  Patients discharged the day of surgery will not be allowed to drive home.   Name and phone number of your driver:   family Special instructions:  none  Please read over the following fact sheets that you were given. Coughing and Deep Breathing, Surgical Site Infection Prevention, Anesthesia Post-op Instructions and Care and Recovery After Surgery      Surgical Procedures for Hemorrhoids, Care After Refer to this sheet in the next few weeks. These instructions provide you with information about caring for yourself after your procedure. Your health care provider may also give you more specific instructions. Your treatment has been planned according to current medical practices, but problems sometimes occur. Call your health care provider if you have any problems or questions after your procedure. WHAT TO EXPECT AFTER THE PROCEDURE After your procedure, it is common to have:  Rectal pain.  Pain when you are  having a bowel movement.  Slight rectal bleeding. HOME CARE INSTRUCTIONS Medicines  Take over-the-counter and prescription medicines only as told by your health care provider.  Do not drive or operate heavy machinery while taking prescription pain medicine.  Use a stool softener or a bulk laxative as told by your health care provider. Activities  Rest at home. Return to your normal activities as told by your health care provider.  Do not lift anything that is heavier than 10 lb (4.5 kg).  Do not sit for long periods of time. Take a walk every day or as told by your health care provider.  Do not strain to have a bowel movement. Do not spend a long time sitting on the toilet. Eating and Drinking  Eat foods that contain fiber, such as whole grains, beans, nuts, fruits, and vegetables.  Drink enough fluid to keep your urine clear or pale yellow. General Instructions  Sit in a warm bath 2-3 times per day to relieve soreness or itching.  Keep all follow-up visits as told by your health care provider. This is important. SEEK MEDICAL CARE IF:  Your pain medicine is not helping.  You have a fever or chills.  You become constipated.  You have trouble passing urine. SEEK IMMEDIATE MEDICAL CARE IF:  You have very bad rectal pain.  You have heavy bleeding from your rectum.   This information is not intended to replace advice given to you by  your health care provider. Make sure you discuss any questions you have with your health care provider.   Document Released: 09/12/2003 Document Revised: 03/13/2015 Document Reviewed: 09/17/2014 Elsevier Interactive Patient Education 2016 [employer]. Hemorrhoids Hemorrhoids are puffy (swollen) veins around the rectum or anus. Hemorrhoids can cause pain, itching, bleeding, or irritation. HOME CARE  Eat foods with fiber, such as whole grains, beans, nuts, fruits, and vegetables. Ask your doctor about taking products with added fiber in  them (fibersupplements).  Drink enough fluid to keep your pee (urine) clear or pale yellow.  Exercise often.  Go to the bathroom when you have the urge to poop. Do not wait.  Avoid straining to poop (bowel movement).  Keep the butt area dry and clean. Use wet toilet paper or moist paper towels.  Medicated creams and medicine inserted into the anus (anal suppository) may be used or applied as told.  Only take medicine as told by your doctor.  Take a warm water bath (sitz bath) for 15-20 minutes to ease pain. Do this 3-4 times a day.  Place ice packs on the area if it is tender or puffy. Use the ice packs between the warm water baths.  Put ice in a plastic bag.  Place a towel between your skin and the bag.  Leave the ice on for 15-20 minutes, 03-04 times a day.  Do not use a donut-shaped pillow or sit on the toilet for a long time. GET HELP RIGHT AWAY IF:   You have more pain that is not controlled by treatment or medicine.  You have bleeding that will not stop.  You have trouble or are unable to poop (bowel movement).  You have pain or puffiness outside the area of the hemorrhoids. MAKE SURE YOU:   Understand these instructions.  Will watch your condition.  Will get help right away if you are not doing well or get worse.   This information is not intended to replace advice given to you by your health care provider. Make sure you discuss any questions you have with your health care provider.   Document Released: 03/31/2008 Document Revised: 06/08/2012 Document Reviewed: 05/03/2012 Elsevier Interactive Patient Education 2016 [employer] PATIENT INSTRUCTIONS POST-ANESTHESIA  IMMEDIATELY FOLLOWING SURGERY:  Do not drive or operate machinery for the first twenty four hours after surgery.  Do not make any important decisions for twenty four hours after surgery or while taking narcotic pain medications or sedatives.  If you develop intractable nausea and vomiting or a  severe headache please notify your doctor immediately.  FOLLOW-UP:  Please make an appointment with your surgeon as instructed. You do not need to follow up with anesthesia unless specifically instructed to do so.  WOUND CARE INSTRUCTIONS (if applicable):  Keep a dry clean dressing on the anesthesia/puncture wound site if there is drainage.  Once the wound has quit draining you may leave it open to air.  Generally you should leave the bandage intact for twenty four hours unless there is drainage.  If the epidural site drains for more than 36-48 hours please call the anesthesia department.  QUESTIONS?:  Please feel free to call your physician or the hospital operator if you have any questions, and they will be happy to assist you.

## 2015-12-06 NOTE — Telephone Encounter (Signed)
Please call pt. He had ONE HYPERPLASTIC POLYP removed.   DRINK WATER TO KEEP YOUR URINE LIGHT YELLOW.  FOLLOW A HIGH FIBER DIET. AVOID ITEMS THAT CAUSE BLOATING & GAS.   USE PREPARATION H CREAM OR ANUSOL CREAM FOUR TIMES  A DAY IF NEEDED TO RELIEVE RECTAL PAIN/PRESSURE/BLEEDING. YOU SHOULD NOT USE ANUSOL CREAM MORE THAN 2 WEEKS AT A TIME AND NO MORE THAN 3 OR 4 TIMES A YEAR.  NEXT COLONOSCOPY IN 10 YEARS.
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
## 2015-12-09 ENCOUNTER — Ambulatory Visit (HOSPITAL_COMMUNITY): Payer: Commercial Managed Care - PPO | Admitting: Anesthesiology

## 2015-12-09 ENCOUNTER — Encounter (HOSPITAL_COMMUNITY): Payer: Self-pay | Admitting: *Deleted

## 2015-12-09 ENCOUNTER — Encounter (HOSPITAL_COMMUNITY)
Admission: RE | Disposition: A | Discharge status: Home / Self Care | Payer: Self-pay | Source: Ambulatory Visit | Attending: Surgery

## 2015-12-09 ENCOUNTER — Ambulatory Visit (HOSPITAL_COMMUNITY)
Admission: RE | Admit: 2015-12-09 | Discharge: 2015-12-09 | Disposition: A | Discharge status: Home / Self Care | Payer: Commercial Managed Care - PPO | Source: Ambulatory Visit | Attending: Surgery | Admitting: Surgery

## 2015-12-09 DIAGNOSIS — K641 Second degree hemorrhoids: Secondary | ICD-10-CM | POA: Insufficient documentation

## 2015-12-09 DIAGNOSIS — K219 Gastro-esophageal reflux disease without esophagitis: Secondary | ICD-10-CM | POA: Diagnosis not present

## 2015-12-09 DIAGNOSIS — E663 Overweight: Secondary | ICD-10-CM | POA: Insufficient documentation

## 2015-12-09 DIAGNOSIS — J449 Chronic obstructive pulmonary disease, unspecified: Secondary | ICD-10-CM | POA: Diagnosis not present

## 2015-12-09 DIAGNOSIS — K644 Residual hemorrhoidal skin tags: Secondary | ICD-10-CM | POA: Insufficient documentation

## 2015-12-09 DIAGNOSIS — F418 Other specified anxiety disorders: Secondary | ICD-10-CM | POA: Diagnosis not present

## 2015-12-09 DIAGNOSIS — F17210 Nicotine dependence, cigarettes, uncomplicated: Secondary | ICD-10-CM | POA: Insufficient documentation

## 2015-12-09 DIAGNOSIS — K642 Third degree hemorrhoids: Secondary | ICD-10-CM | POA: Insufficient documentation

## 2015-12-09 DIAGNOSIS — Z79899 Other long term (current) drug therapy: Secondary | ICD-10-CM | POA: Diagnosis not present

## 2015-12-09 HISTORY — PX: HEMORRHOID SURGERY: SHX153

## 2015-12-09 SURGERY — HEMORRHOIDECTOMY
Anesthesia: General | Site: Rectum

## 2015-12-09 MED ORDER — LIDOCAINE VISCOUS 2 % MT SOLN
OROMUCOSAL | Status: DC | PRN
  Administered 2015-12-09: 1

## 2015-12-09 MED ORDER — CEFAZOLIN SODIUM-DEXTROSE 2-4 GM/100ML-% IV SOLN
INTRAVENOUS | Status: AC
  Filled 2015-12-09: qty 100

## 2015-12-09 MED ORDER — PROPOFOL 10 MG/ML IV BOLUS
INTRAVENOUS | Status: AC
  Filled 2015-12-09: qty 40

## 2015-12-09 MED ORDER — ONDANSETRON HCL 4 MG/2ML IJ SOLN
4.0000 mg | Freq: Once | INTRAMUSCULAR | Status: AC
  Administered 2015-12-09: 4 mg via INTRAVENOUS

## 2015-12-09 MED ORDER — SUCCINYLCHOLINE CHLORIDE 20 MG/ML IJ SOLN
INTRAMUSCULAR | Status: AC
  Filled 2015-12-09: qty 1

## 2015-12-09 MED ORDER — LIDOCAINE HCL (PF) 1 % IJ SOLN
INTRAMUSCULAR | Status: AC
  Filled 2015-12-09: qty 30

## 2015-12-09 MED ORDER — MIDAZOLAM HCL 2 MG/2ML IJ SOLN
INTRAMUSCULAR | Status: AC
  Filled 2015-12-09: qty 2

## 2015-12-09 MED ORDER — 0.9 % SODIUM CHLORIDE (POUR BTL) OPTIME
TOPICAL | Status: DC | PRN
  Administered 2015-12-09: 1000 mL

## 2015-12-09 MED ORDER — LIDOCAINE HCL (CARDIAC) 20 MG/ML IV SOLN
INTRAVENOUS | Status: DC | PRN
  Administered 2015-12-09: 50 mg via INTRAVENOUS

## 2015-12-09 MED ORDER — BUPIVACAINE HCL (PF) 0.5 % IJ SOLN
INTRAMUSCULAR | Status: AC
  Filled 2015-12-09: qty 30

## 2015-12-09 MED ORDER — ONDANSETRON HCL 4 MG/2ML IJ SOLN
INTRAMUSCULAR | Status: AC
  Filled 2015-12-09: qty 2

## 2015-12-09 MED ORDER — FENTANYL CITRATE (PF) 100 MCG/2ML IJ SOLN: 25.0000 ug | INTRAMUSCULAR | Status: DC | PRN

## 2015-12-09 MED ORDER — FENTANYL CITRATE (PF) 100 MCG/2ML IJ SOLN
INTRAMUSCULAR | Status: DC | PRN
  Administered 2015-12-09 (×6): 50 ug via INTRAVENOUS

## 2015-12-09 MED ORDER — ALBUTEROL SULFATE HFA 108 (90 BASE) MCG/ACT IN AERS
INHALATION_SPRAY | RESPIRATORY_TRACT | Status: AC
  Filled 2015-12-09: qty 6.7

## 2015-12-09 MED ORDER — LACTATED RINGERS IV SOLN
INTRAVENOUS | Status: DC | PRN
  Administered 2015-12-09 (×2): via INTRAVENOUS

## 2015-12-09 MED ORDER — CEFAZOLIN SODIUM-DEXTROSE 2-4 GM/100ML-% IV SOLN
2.0000 g | INTRAVENOUS | Status: AC
  Administered 2015-12-09: 2 g via INTRAVENOUS

## 2015-12-09 MED ORDER — ONDANSETRON HCL 4 MG/2ML IJ SOLN: 4.0000 mg | Freq: Once | INTRAMUSCULAR | Status: DC | PRN

## 2015-12-09 MED ORDER — SUCCINYLCHOLINE CHLORIDE 20 MG/ML IJ SOLN
INTRAMUSCULAR | Status: DC | PRN
  Administered 2015-12-09: 180 mg via INTRAVENOUS

## 2015-12-09 MED ORDER — LACTATED RINGERS IV SOLN
INTRAVENOUS | Status: DC
Start: 2015-12-09 — End: 2015-12-09
  Administered 2015-12-09: 1000 mL via INTRAVENOUS

## 2015-12-09 MED ORDER — SURGILUBE EX GEL
CUTANEOUS | Status: DC | PRN
  Administered 2015-12-09: 1 via TOPICAL

## 2015-12-09 MED ORDER — MIDAZOLAM HCL 2 MG/2ML IJ SOLN
1.0000 mg | INTRAMUSCULAR | Status: DC | PRN
  Administered 2015-12-09: 2 mg via INTRAVENOUS

## 2015-12-09 MED ORDER — FENTANYL CITRATE (PF) 250 MCG/5ML IJ SOLN
INTRAMUSCULAR | Status: AC
  Filled 2015-12-09: qty 5

## 2015-12-09 MED ORDER — PROPOFOL 10 MG/ML IV BOLUS
INTRAVENOUS | Status: DC | PRN
  Administered 2015-12-09: 150 mg via INTRAVENOUS
  Administered 2015-12-09 (×2): 50 mg via INTRAVENOUS

## 2015-12-09 MED ORDER — LIDOCAINE HCL 1 % IJ SOLN
INTRAMUSCULAR | Status: DC | PRN
  Administered 2015-12-09: 8 mL via INTRAMUSCULAR

## 2015-12-09 MED ORDER — LIDOCAINE VISCOUS 2 % MT SOLN
OROMUCOSAL | Status: AC
  Filled 2015-12-09: qty 15

## 2015-12-09 SURGICAL SUPPLY — 29 items
BAG HAMPER (MISCELLANEOUS) ×2 IMPLANT
CLOTH BEACON ORANGE TIMEOUT ST (SAFETY) ×2 IMPLANT
COVER LIGHT HANDLE STERIS (MISCELLANEOUS) ×4 IMPLANT
COVER MAYO STAND XLG (DRAPE) ×2 IMPLANT
DECANTER SPIKE VIAL GLASS SM (MISCELLANEOUS) ×2 IMPLANT
DRAPE PROXIMA HALF (DRAPES) ×2 IMPLANT
ELECT REM PT RETURN 9FT ADLT (ELECTROSURGICAL) ×2
ELECTRODE REM PT RTRN 9FT ADLT (ELECTROSURGICAL) ×1 IMPLANT
FORMALIN 10 PREFIL 120ML (MISCELLANEOUS) ×2 IMPLANT
GAUZE SPONGE 4X4 12PLY STRL (GAUZE/BANDAGES/DRESSINGS) ×2 IMPLANT
GLOVE BIOGEL PI IND STRL 7.5 (GLOVE) ×1 IMPLANT
GLOVE BIOGEL PI INDICATOR 7.5 (GLOVE) ×1
GLOVE ECLIPSE 6.5 STRL STRAW (GLOVE) ×1 IMPLANT
GLOVE ECLIPSE 7.0 STRL STRAW (GLOVE) ×2 IMPLANT
GLOVE EXAM NITRILE MD LF STRL (GLOVE) ×1 IMPLANT
GOWN STRL REUS W/TWL LRG LVL3 (GOWN DISPOSABLE) ×4 IMPLANT
HEMOSTAT SURGICEL 4X8 (HEMOSTASIS) ×2 IMPLANT
KIT ROOM TURNOVER APOR (KITS) ×2 IMPLANT
LIGASURE IMPACT 36 18CM CVD LR (INSTRUMENTS) ×1 IMPLANT
MANIFOLD NEPTUNE II (INSTRUMENTS) ×2 IMPLANT
NDL HYPO 25X1 1.5 SAFETY (NEEDLE) ×1 IMPLANT
NEEDLE HYPO 25X1 1.5 SAFETY (NEEDLE) ×2 IMPLANT
NS IRRIG 1000ML POUR BTL (IV SOLUTION) ×2 IMPLANT
PACK PERI GYN (CUSTOM PROCEDURE TRAY) ×2 IMPLANT
PAD ARMBOARD 7.5X6 YLW CONV (MISCELLANEOUS) ×2 IMPLANT
SET BASIN LINEN APH (SET/KITS/TRAYS/PACK) ×2 IMPLANT
SUT CHROMIC 3 0 SH 27 (SUTURE) ×4 IMPLANT
SUT SILK 0 FSL (SUTURE) ×2 IMPLANT
SYRINGE 10CC LL (SYRINGE) ×2 IMPLANT

## 2015-12-09 NOTE — Op Note (Signed)
SURGICAL OPERATIVE REPORT   ATTENDING SURGEON: Surgeon(s): Ancil LinseyJason Evan Keymarion Bearman, MD  ANESTHESIA: GETA  PRE-OPERATIVE DIAGNOSIS: Symptomatic 2nd and 3rd degree internal and external hemorrhoids not well-controlled with medical management  POST-OPERATIVE DIAGNOSIS: Symptomatic 2nd and 3rd degree internal and external hemorrhoids not well-controlled with medical management   PROCEDURE(S):  1.) Anoscopy 2.) Extensive hemorrhectomy (Left posterior internal and external and Right anterior internal hemorrhoids) and excision of skin tag(s) x2  INTRAOPERATIVE FINDINGS: 2nd and 3rd degree internal hemorrhoids and external hemorrhoids with skin tags  INTRAVENOUS FLUIDS: 1300 mL crystalloid   ESTIMATED BLOOD LOSS: Minimal (<20 mL)  URINE OUTPUT: No foley  SPECIMENS: Left posterior internal and external, Right anterior internal and skin tag(s) x2  IMPLANTS: None  DRAINS: None   COMPLICATIONS: None apparent   CONDITION AT END OF PROCEDURE: Hemodynamically stable and extubated   DISPOSITION OF PATIENT: PACU  INDICATIONS FOR PROCEDURE:  Patient is a 50 y.o. male who presented with symptomatic 2nd/3rd degree internal and external hemorrhoids not well-controlled with medical management. Patient underwent recent colonoscopy, which revealed 2nd and 3rd degree internal hemorrhoids and external hemorrhoids with no significant additional findings. All risks, benefits, and alternatives to above procedure were discussed with the patient, all of patient's questions were answered to his expressed satisfaction, and informed consent was obtained and documented.  DETAILS OF PROCEDURE: Patient was brought to the operative suite and appropriately identified. General anesthesia was administered along with appropriate pre-operative antibiotics, and endotracheal intubation was performed by anesthetist. In high lithotomy position, operative site was prepped and draped in the usual sterile fashion, and following  a brief time out, rigid anoscopy was performed with findings of 2nd and 3rd degree internal hemorrhoids and external hemorrhoids. Local anesthetic was injected, and three hemorrhoidal pedicles were identified. An Alice clamp was placed near the base of first the Left posterior internal pedicle and then the Left posterior external pedicle near the dentate line and retracted externally to exteriorize the hemorrhoidal pedicle. Retracted hemorrhoid was carefully dissected from underlying/adherent tissues, and Ligasure was advanced across the base of the hemorrhoidal pedicle and fired, and the above was repeated for Right anterior internal hemorrhoids. Hemostasis was confirmed, additional local anesthetic was injected, and lidocaine gel-soaked Surgicel wrapped around 4x4" gauze was inserted into the anal canal for additional hemostasis and pain control  Patient was then safely able to be extubated, awaken, and transferred to PACU for post-operative monitoring and care.  I was present for all aspects of the above procedure, and there were no complications apparent.

## 2015-12-09 NOTE — Discharge Instructions (Signed)
Surgical Procedures for Hemorrhoids, Care After °Refer to this sheet in the next few weeks. These instructions provide you with information about caring for yourself after your procedure. Your health care provider may also give you more specific instructions. Your treatment has been planned according to current medical practices, but problems sometimes occur. Call your health care provider if you have any problems or questions after your procedure. °WHAT TO EXPECT AFTER THE PROCEDURE °After your procedure, it is common to have: °· Rectal pain. °· Pain when you are having a bowel movement. °· Slight rectal bleeding. °HOME CARE INSTRUCTIONS °Medicines °· Take over-the-counter and prescription medicines only as told by your health care provider. °· Do not drive or operate heavy machinery while taking prescription pain medicine. °· Use a stool softener or a bulk laxative as told by your health care provider. °Activities °· Rest at home. Return to your normal activities as told by your health care provider. °· Do not lift anything that is heavier than 10 lb (4.5 kg). °· Do not sit for long periods of time. Take a walk every day or as told by your health care provider. °· Do not strain to have a bowel movement. Do not spend a long time sitting on the toilet. °Eating and Drinking °· Eat foods that contain fiber, such as whole grains, beans, nuts, fruits, and vegetables. °· Drink enough fluid to keep your urine clear or pale yellow. °General Instructions °· Sit in a warm bath 2-3 times per day to relieve soreness or itching. °· Keep all follow-up visits as told by your health care provider. This is important. °SEEK MEDICAL CARE IF: °· Your pain medicine is not helping. °· You have a fever or chills. °· You become constipated. °· You have trouble passing urine. °SEEK IMMEDIATE MEDICAL CARE IF: °· You have very bad rectal pain. °· You have heavy bleeding from your rectum. °  °This information is not intended to replace advice  given to you by your health care provider. Make sure you discuss any questions you have with your health care provider. °  °Document Released: 09/12/2003 Document Revised: 03/13/2015 Document Reviewed: 09/17/2014 °Elsevier Interactive Patient Education ©2016 [employer] ° °

## 2015-12-09 NOTE — Telephone Encounter (Signed)
LM for a return call.  

## 2015-12-09 NOTE — Interval H&P Note (Signed)
History and Physical Interval Note:  12/09/2015 7:42 AM  Bobby Gonzales  has presented today for surgery, with the diagnosis of internal and external hemorrhoids  The various methods of treatment have been discussed with the patient and family. After consideration of risks, benefits and other options for treatment, the patient has consented to  Procedure(s): EXTENSIVE HEMORRHOIDECTOMY (N/A) as a surgical intervention .  The patient's history has been reviewed, patient examined, no change in status, stable for surgery.  I have reviewed the patient's chart and labs.  Questions were answered to the patient's satisfaction.     Ancil LinseyJason Evan Alynah Schone

## 2015-12-09 NOTE — Anesthesia Preprocedure Evaluation (Addendum)
Anesthesia Evaluation  Patient identified by MRN, date of birth, ID band Patient awake    Reviewed: Allergy & Precautions, NPO status , Patient's Chart, lab work & pertinent test results  Airway Mallampati: II  TM Distance: >3 FB     Dental  (+) Edentulous Upper, Edentulous Lower   Pulmonary COPD, Current Smoker,    breath sounds clear to auscultation       Cardiovascular negative cardio ROS   Rhythm:Regular Rate:Normal     Neuro/Psych PSYCHIATRIC DISORDERS Anxiety Depression    GI/Hepatic GERD  Medicated and Poorly Controlled,  Endo/Other    Renal/GU      Musculoskeletal   Abdominal   Peds  Hematology   Anesthesia Other Findings   Reproductive/Obstetrics                          Anesthesia Physical Anesthesia Plan  ASA: III  Anesthesia Plan: General   Post-op Pain Management:    Induction: Intravenous, Rapid sequence and Cricoid pressure planned  Airway Management Planned: Oral ETT  Additional Equipment:   Intra-op Plan:   Post-operative Plan: Extubation in OR  Informed Consent: I have reviewed the patients History and Physical, chart, labs and discussed the procedure including the risks, benefits and alternatives for the proposed anesthesia with the patient or authorized representative who has indicated his/her understanding and acceptance.     Plan Discussed with:   Anesthesia Plan Comments:        Anesthesia Quick Evaluation

## 2015-12-09 NOTE — Progress Notes (Signed)
Awake. Rectal packing d/c'd. No drainage. Tolerated well.

## 2015-12-09 NOTE — Telephone Encounter (Signed)
ON RECALL  °

## 2015-12-09 NOTE — Anesthesia Postprocedure Evaluation (Signed)
Anesthesia Post Note  Patient: Bobby Gonzales  Procedure(s) Performed: Procedure(s) (LRB): EXTENSIVE HEMORRHOIDECTOMY (N/A)  Patient location during evaluation: PACU Anesthesia Type: General Level of consciousness: awake and alert and oriented Pain management: pain level controlled Vital Signs Assessment: post-procedure vital signs reviewed and stable Respiratory status: spontaneous breathing Cardiovascular status: stable Postop Assessment: no signs of nausea or vomiting Anesthetic complications: no    Last Vitals:  Filed Vitals:   12/09/15 0858 12/09/15 0900  BP: 125/80 123/74  Pulse: 80 80  Temp: 36.7 C   Resp: 16 20    Last Pain:  Filed Vitals:   12/09/15 0909  PainSc: 0-No pain                 ADAMS, AMY A

## 2015-12-09 NOTE — Transfer of Care (Signed)
Immediate Anesthesia Transfer of Care Note  Patient: Bobby RosebushRussell Wray  Procedure(s) Performed: Procedure(s): EXTENSIVE HEMORRHOIDECTOMY (N/A)  Patient Location: PACU  Anesthesia Type:General  Level of Consciousness: sedated and patient cooperative  Airway & Oxygen Therapy: Patient Spontanous Breathing and Patient connected to face mask oxygen  Post-op Assessment: Report given to RN and Post -op Vital signs reviewed and stable  Post vital signs: Reviewed and stable  Last Vitals:  Filed Vitals:   12/09/15 0715 12/09/15 0730  BP: 123/83 117/79  Pulse:    Temp:    Resp: 29 15    Last Pain: There were no vitals filed for this visit.       Complications: No apparent anesthesia complications

## 2015-12-09 NOTE — Anesthesia Procedure Notes (Signed)
Procedure Name: Intubation Date/Time: 12/09/2015 7:53 AM Performed by: Pernell DupreADAMS, AMY A Pre-anesthesia Checklist: Patient identified, Patient being monitored, Timeout performed, Emergency Drugs available and Suction available Patient Re-evaluated:Patient Re-evaluated prior to inductionOxygen Delivery Method: Circle system utilized Preoxygenation: Pre-oxygenation with 100% oxygen Intubation Type: IV induction, Rapid sequence and Cricoid Pressure applied Laryngoscope Size: 3 and Miller Grade View: Grade I Tube type: Oral Tube size: 7.0 mm Number of attempts: 1 Airway Equipment and Method: Stylet Placement Confirmation: ETT inserted through vocal cords under direct vision,  positive ETCO2 and breath sounds checked- equal and bilateral Secured at: 21 cm Tube secured with: Tape Dental Injury: Teeth and Oropharynx as per pre-operative assessment

## 2015-12-10 NOTE — Telephone Encounter (Signed)
Pt called and is aware of results.  

## 2015-12-11 ENCOUNTER — Encounter (HOSPITAL_COMMUNITY): Payer: Self-pay | Admitting: Surgery

## 2017-07-01 ENCOUNTER — Other Ambulatory Visit (HOSPITAL_COMMUNITY): Payer: Self-pay | Admitting: Internal Medicine

## 2017-07-01 DIAGNOSIS — I779 Disorder of arteries and arterioles, unspecified: Secondary | ICD-10-CM

## 2017-07-19 ENCOUNTER — Ambulatory Visit (HOSPITAL_COMMUNITY)
Admission: RE | Admit: 2017-07-19 | Discharge: 2017-07-19 | Disposition: A | Discharge status: Home / Self Care | Payer: BLUE CROSS/BLUE SHIELD | Source: Ambulatory Visit | Attending: Internal Medicine | Admitting: Internal Medicine

## 2017-07-19 ENCOUNTER — Ambulatory Visit (HOSPITAL_COMMUNITY): Payer: Commercial Managed Care - PPO

## 2017-07-19 DIAGNOSIS — I779 Disorder of arteries and arterioles, unspecified: Secondary | ICD-10-CM

## 2017-07-19 DIAGNOSIS — I739 Peripheral vascular disease, unspecified: Secondary | ICD-10-CM | POA: Diagnosis present

## 2017-09-15 ENCOUNTER — Ambulatory Visit (HOSPITAL_COMMUNITY)
Admission: RE | Admit: 2017-09-15 | Discharge: 2017-09-15 | Disposition: A | Discharge status: Home / Self Care | Payer: BLUE CROSS/BLUE SHIELD | Source: Ambulatory Visit | Attending: Vascular Surgery | Admitting: Vascular Surgery

## 2017-09-15 ENCOUNTER — Encounter: Payer: Self-pay | Admitting: Vascular Surgery

## 2017-09-15 ENCOUNTER — Ambulatory Visit: Payer: BLUE CROSS/BLUE SHIELD | Admitting: Vascular Surgery

## 2017-09-15 ENCOUNTER — Other Ambulatory Visit: Payer: Self-pay | Admitting: Vascular Surgery

## 2017-09-15 ENCOUNTER — Other Ambulatory Visit: Payer: Self-pay

## 2017-09-15 DIAGNOSIS — I872 Venous insufficiency (chronic) (peripheral): Secondary | ICD-10-CM

## 2017-09-15 DIAGNOSIS — R21 Rash and other nonspecific skin eruption: Secondary | ICD-10-CM | POA: Diagnosis not present

## 2017-09-15 DIAGNOSIS — R600 Localized edema: Secondary | ICD-10-CM
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
## 2017-10-13 ENCOUNTER — Ambulatory Visit: Payer: BLUE CROSS/BLUE SHIELD | Admitting: Vascular Surgery

## 2019-05-01 DIAGNOSIS — E785 Hyperlipidemia, unspecified: Secondary | ICD-10-CM | POA: Diagnosis not present

## 2019-05-01 DIAGNOSIS — E782 Mixed hyperlipidemia: Secondary | ICD-10-CM | POA: Diagnosis not present

## 2019-05-01 DIAGNOSIS — R7301 Impaired fasting glucose: Secondary | ICD-10-CM | POA: Diagnosis not present

## 2019-05-01 DIAGNOSIS — R7303 Prediabetes: Secondary | ICD-10-CM | POA: Diagnosis not present

## 2019-05-15 DIAGNOSIS — J449 Chronic obstructive pulmonary disease, unspecified: Secondary | ICD-10-CM | POA: Diagnosis not present

## 2019-05-15 DIAGNOSIS — L239 Allergic contact dermatitis, unspecified cause: Secondary | ICD-10-CM | POA: Diagnosis not present

## 2019-05-15 DIAGNOSIS — R7301 Impaired fasting glucose: Secondary | ICD-10-CM | POA: Diagnosis not present

## 2019-05-15 DIAGNOSIS — M5030 Other cervical disc degeneration, unspecified cervical region: Secondary | ICD-10-CM | POA: Diagnosis not present

## 2019-05-15 DIAGNOSIS — E782 Mixed hyperlipidemia: Secondary | ICD-10-CM | POA: Diagnosis not present

## 2019-07-11 ENCOUNTER — Ambulatory Visit: Payer: Medicare Other | Attending: Internal Medicine

## 2019-07-11 ENCOUNTER — Other Ambulatory Visit: Payer: Self-pay

## 2019-07-11 DIAGNOSIS — Z20822 Contact with and (suspected) exposure to covid-19: Secondary | ICD-10-CM | POA: Insufficient documentation

## 2019-07-14 LAB — NOVEL CORONAVIRUS, NAA

## 2019-07-17 ENCOUNTER — Other Ambulatory Visit: Payer: Self-pay

## 2019-07-17 ENCOUNTER — Ambulatory Visit: Payer: Medicare Other | Attending: Internal Medicine

## 2019-07-17 DIAGNOSIS — U071 COVID-19: Secondary | ICD-10-CM | POA: Insufficient documentation

## 2019-07-17 DIAGNOSIS — Z20822 Contact with and (suspected) exposure to covid-19: Secondary | ICD-10-CM

## 2019-07-18 LAB — NOVEL CORONAVIRUS, NAA: SARS-CoV-2, NAA: DETECTED — AB

## 2019-08-28 DIAGNOSIS — L03116 Cellulitis of left lower limb: Secondary | ICD-10-CM | POA: Diagnosis not present

## 2019-08-28 DIAGNOSIS — L258 Unspecified contact dermatitis due to other agents: Secondary | ICD-10-CM | POA: Diagnosis not present

## 2019-10-03 DIAGNOSIS — E1165 Type 2 diabetes mellitus with hyperglycemia: Secondary | ICD-10-CM | POA: Diagnosis not present

## 2019-10-03 DIAGNOSIS — I10 Essential (primary) hypertension: Secondary | ICD-10-CM | POA: Diagnosis not present

## 2019-10-05 DIAGNOSIS — E782 Mixed hyperlipidemia: Secondary | ICD-10-CM | POA: Diagnosis not present

## 2019-10-05 DIAGNOSIS — R7303 Prediabetes: Secondary | ICD-10-CM | POA: Diagnosis not present

## 2019-10-05 DIAGNOSIS — E785 Hyperlipidemia, unspecified: Secondary | ICD-10-CM | POA: Diagnosis not present

## 2019-10-05 DIAGNOSIS — F17200 Nicotine dependence, unspecified, uncomplicated: Secondary | ICD-10-CM | POA: Diagnosis not present

## 2019-10-05 DIAGNOSIS — R7301 Impaired fasting glucose: Secondary | ICD-10-CM | POA: Diagnosis not present

## 2019-10-05 DIAGNOSIS — F17208 Nicotine dependence, unspecified, with other nicotine-induced disorders: Secondary | ICD-10-CM | POA: Diagnosis not present

## 2019-10-05 DIAGNOSIS — Z0000 Encounter for general adult medical examination without abnormal findings: Secondary | ICD-10-CM | POA: Diagnosis not present

## 2019-10-10 DIAGNOSIS — L239 Allergic contact dermatitis, unspecified cause: Secondary | ICD-10-CM | POA: Diagnosis not present

## 2019-10-10 DIAGNOSIS — R7301 Impaired fasting glucose: Secondary | ICD-10-CM | POA: Diagnosis not present

## 2019-10-10 DIAGNOSIS — J449 Chronic obstructive pulmonary disease, unspecified: Secondary | ICD-10-CM | POA: Diagnosis not present

## 2019-10-10 DIAGNOSIS — M5030 Other cervical disc degeneration, unspecified cervical region: Secondary | ICD-10-CM | POA: Diagnosis not present

## 2019-10-10 DIAGNOSIS — E782 Mixed hyperlipidemia: Secondary | ICD-10-CM | POA: Diagnosis not present

## 2019-10-19 DIAGNOSIS — E785 Hyperlipidemia, unspecified: Secondary | ICD-10-CM | POA: Diagnosis not present

## 2019-10-19 DIAGNOSIS — B370 Candidal stomatitis: Secondary | ICD-10-CM | POA: Diagnosis not present

## 2019-10-19 DIAGNOSIS — N3000 Acute cystitis without hematuria: Secondary | ICD-10-CM | POA: Diagnosis not present

## 2019-10-19 DIAGNOSIS — E782 Mixed hyperlipidemia: Secondary | ICD-10-CM | POA: Diagnosis not present

## 2019-10-19 DIAGNOSIS — B3781 Candidal esophagitis: Secondary | ICD-10-CM | POA: Diagnosis not present

## 2019-10-19 DIAGNOSIS — R7303 Prediabetes: Secondary | ICD-10-CM | POA: Diagnosis not present

## 2019-12-08 DIAGNOSIS — J019 Acute sinusitis, unspecified: Secondary | ICD-10-CM | POA: Diagnosis not present

## 2019-12-28 DIAGNOSIS — J441 Chronic obstructive pulmonary disease with (acute) exacerbation: Secondary | ICD-10-CM | POA: Diagnosis not present

## 2019-12-28 DIAGNOSIS — J019 Acute sinusitis, unspecified: Secondary | ICD-10-CM | POA: Diagnosis not present

## 2020-01-03 ENCOUNTER — Other Ambulatory Visit (HOSPITAL_COMMUNITY): Payer: Self-pay | Admitting: Radiology

## 2020-01-03 DIAGNOSIS — J441 Chronic obstructive pulmonary disease with (acute) exacerbation: Secondary | ICD-10-CM

## 2020-02-23 ENCOUNTER — Other Ambulatory Visit (HOSPITAL_COMMUNITY)
Admission: RE | Admit: 2020-02-23 | Discharge: 2020-02-23 | Disposition: A | Discharge status: Home / Self Care | Payer: Medicare Other | Source: Ambulatory Visit | Attending: Internal Medicine | Admitting: Internal Medicine

## 2020-02-23 ENCOUNTER — Other Ambulatory Visit: Payer: Self-pay

## 2020-02-23 DIAGNOSIS — Z01812 Encounter for preprocedural laboratory examination: Secondary | ICD-10-CM | POA: Diagnosis not present

## 2020-02-23 DIAGNOSIS — Z20822 Contact with and (suspected) exposure to covid-19: Secondary | ICD-10-CM | POA: Diagnosis not present

## 2020-02-23 LAB — SARS CORONAVIRUS 2 (TAT 6-24 HRS): SARS Coronavirus 2: NEGATIVE

## 2020-02-27 ENCOUNTER — Other Ambulatory Visit: Payer: Self-pay

## 2020-02-27 ENCOUNTER — Ambulatory Visit (HOSPITAL_COMMUNITY)
Admission: RE | Admit: 2020-02-27 | Discharge: 2020-02-27 | Disposition: A | Discharge status: Home / Self Care | Payer: Medicare Other | Source: Ambulatory Visit | Attending: Internal Medicine | Admitting: Internal Medicine

## 2020-02-27 DIAGNOSIS — J441 Chronic obstructive pulmonary disease with (acute) exacerbation: Secondary | ICD-10-CM | POA: Insufficient documentation

## 2020-02-27 LAB — PULMONARY FUNCTION TEST
DL/VA % pred: 80 %
DL/VA: 3.46 ml/min/mmHg/L
DLCO unc % pred: 81 %
DLCO unc: 24.81 ml/min/mmHg
FEF 25-75 Post: 1.61 L/sec
FEF 25-75 Pre: 1.39 L/sec
FEF2575-%Change-Post: 15 %
FEF2575-%Pred-Post: 47 %
FEF2575-%Pred-Pre: 40 %
FEV1-%Change-Post: 6 %
FEV1-%Pred-Post: 63 %
FEV1-%Pred-Pre: 59 %
FEV1-Post: 2.55 L
FEV1-Pre: 2.4 L
FEV1FVC-%Change-Post: -5 %
FEV1FVC-%Pred-Pre: 86 %
FEV6-%Change-Post: 7 %
FEV6-%Pred-Post: 74 %
FEV6-%Pred-Pre: 69 %
FEV6-Post: 3.79 L
FEV6-Pre: 3.53 L
FEV6FVC-%Change-Post: -4 %
FEV6FVC-%Pred-Post: 95 %
FEV6FVC-%Pred-Pre: 100 %
FVC-%Change-Post: 12 %
FVC-%Pred-Post: 78 %
FVC-%Pred-Pre: 69 %
FVC-Post: 4.11 L
FVC-Pre: 3.64 L
Post FEV1/FVC ratio: 62 %
Post FEV6/FVC ratio: 92 %
Pre FEV1/FVC ratio: 66 %
Pre FEV6/FVC Ratio: 97 %
RV % pred: 198 %
RV: 4.46 L
TLC % pred: 115 %
TLC: 8.56 L

## 2020-02-27 MED ORDER — ALBUTEROL SULFATE (2.5 MG/3ML) 0.083% IN NEBU
2.5000 mg | INHALATION_SOLUTION | Freq: Once | RESPIRATORY_TRACT | Status: AC
  Administered 2020-02-27: 2.5 mg via RESPIRATORY_TRACT

## 2020-03-13 ENCOUNTER — Other Ambulatory Visit: Payer: Self-pay

## 2020-03-13 ENCOUNTER — Ambulatory Visit (HOSPITAL_COMMUNITY)
Admission: RE | Admit: 2020-03-13 | Discharge: 2020-03-13 | Disposition: A | Discharge status: Home / Self Care | Payer: Medicare Other | Source: Ambulatory Visit | Attending: Adult Health Nurse Practitioner | Admitting: Adult Health Nurse Practitioner

## 2020-03-13 ENCOUNTER — Other Ambulatory Visit (HOSPITAL_COMMUNITY): Payer: Self-pay | Admitting: Adult Health Nurse Practitioner

## 2020-03-13 DIAGNOSIS — J449 Chronic obstructive pulmonary disease, unspecified: Secondary | ICD-10-CM | POA: Diagnosis not present

## 2020-03-13 DIAGNOSIS — R0602 Shortness of breath: Secondary | ICD-10-CM | POA: Insufficient documentation

## 2020-03-13 DIAGNOSIS — G4700 Insomnia, unspecified: Secondary | ICD-10-CM | POA: Diagnosis not present

## 2020-03-13 DIAGNOSIS — R0902 Hypoxemia: Secondary | ICD-10-CM | POA: Diagnosis not present

## 2020-04-11 DIAGNOSIS — R7301 Impaired fasting glucose: Secondary | ICD-10-CM | POA: Diagnosis not present

## 2020-04-11 DIAGNOSIS — L639 Alopecia areata, unspecified: Secondary | ICD-10-CM | POA: Diagnosis not present

## 2020-04-11 DIAGNOSIS — M4692 Unspecified inflammatory spondylopathy, cervical region: Secondary | ICD-10-CM | POA: Diagnosis not present

## 2020-04-11 DIAGNOSIS — E782 Mixed hyperlipidemia: Secondary | ICD-10-CM | POA: Diagnosis not present

## 2020-04-11 DIAGNOSIS — Z720 Tobacco use: Secondary | ICD-10-CM | POA: Diagnosis not present

## 2020-04-16 DIAGNOSIS — Z0001 Encounter for general adult medical examination with abnormal findings: Secondary | ICD-10-CM | POA: Diagnosis not present

## 2020-04-16 DIAGNOSIS — L239 Allergic contact dermatitis, unspecified cause: Secondary | ICD-10-CM | POA: Diagnosis not present

## 2020-04-22 DIAGNOSIS — K219 Gastro-esophageal reflux disease without esophagitis: Secondary | ICD-10-CM | POA: Diagnosis not present

## 2020-04-22 DIAGNOSIS — J441 Chronic obstructive pulmonary disease with (acute) exacerbation: Secondary | ICD-10-CM | POA: Diagnosis not present

## 2020-04-22 DIAGNOSIS — Z720 Tobacco use: Secondary | ICD-10-CM | POA: Diagnosis not present

## 2020-04-22 DIAGNOSIS — E7849 Other hyperlipidemia: Secondary | ICD-10-CM | POA: Diagnosis not present

## 2020-05-07 ENCOUNTER — Ambulatory Visit: Payer: Medicare Other | Attending: Internal Medicine | Admitting: Neurology

## 2020-05-07 ENCOUNTER — Other Ambulatory Visit: Payer: Self-pay

## 2020-05-07 DIAGNOSIS — Z79899 Other long term (current) drug therapy: Secondary | ICD-10-CM | POA: Diagnosis not present

## 2020-05-07 DIAGNOSIS — R0683 Snoring: Secondary | ICD-10-CM | POA: Insufficient documentation

## 2020-05-07 DIAGNOSIS — Z7951 Long term (current) use of inhaled steroids: Secondary | ICD-10-CM | POA: Diagnosis not present

## 2020-05-07 DIAGNOSIS — J449 Chronic obstructive pulmonary disease, unspecified: Secondary | ICD-10-CM | POA: Insufficient documentation

## 2020-05-07 DIAGNOSIS — R5383 Other fatigue: Secondary | ICD-10-CM | POA: Insufficient documentation

## 2020-05-07 DIAGNOSIS — G4700 Insomnia, unspecified: Secondary | ICD-10-CM | POA: Insufficient documentation

## 2020-05-07 DIAGNOSIS — Z791 Long term (current) use of non-steroidal anti-inflammatories (NSAID): Secondary | ICD-10-CM | POA: Diagnosis not present

## 2020-05-07 DIAGNOSIS — G4733 Obstructive sleep apnea (adult) (pediatric): Secondary | ICD-10-CM | POA: Diagnosis not present

## 2020-05-07 DIAGNOSIS — Z87891 Personal history of nicotine dependence: Secondary | ICD-10-CM | POA: Insufficient documentation

## 2020-05-07 DIAGNOSIS — R0902 Hypoxemia: Secondary | ICD-10-CM | POA: Insufficient documentation

## 2020-05-15 DIAGNOSIS — E7849 Other hyperlipidemia: Secondary | ICD-10-CM | POA: Diagnosis not present

## 2020-05-15 DIAGNOSIS — J441 Chronic obstructive pulmonary disease with (acute) exacerbation: Secondary | ICD-10-CM | POA: Diagnosis not present

## 2020-05-15 DIAGNOSIS — K219 Gastro-esophageal reflux disease without esophagitis: Secondary | ICD-10-CM | POA: Diagnosis not present

## 2020-05-15 DIAGNOSIS — Z720 Tobacco use: Secondary | ICD-10-CM | POA: Diagnosis not present

## 2020-05-16 NOTE — Procedures (Signed)
[facility] Nema Oatley A. Gerilyn Pilgrim, MD     [email or web address]             HOME SLEEP TEST  LOCATION: [facility]  Patient Name: Bobby Gonzales, Bobby Gonzales Date: 05/07/2020 Gender: Male D.O.B: 10-14-1965 Age (years): 54 Referring Provider: Catalina Pizza Height (inches): 72 Interpreting Physician: Beryle Beams MD, ABSM Weight (lbs): 216 RPSGT: Peak, Robert BMI: 29 MRN: [id number] Neck Size: CLINICAL INFORMATION Sleep Study Type: HST     Indication for sleep study: N/A     Epworth Sleepiness Score: N/A  SLEEP STUDY TECHNIQUE A multi-channel overnight portable sleep study was performed. The channels recorded were: nasal airflow, thoracic respiratory movement, and oxygen saturation with a pulse oximetry. Snoring was also monitored.  MEDICATIONS Patient self administered medications include: N/A.  Current Outpatient Medications:  .  ALPRAZolam (XANAX) 1 MG tablet, Take 1 mg by mouth 2 (two) times daily., Disp: , Rfl:  .  betamethasone dipropionate (DIPROLENE) 0.05 % cream, Apply topically 2 (two) times daily., Disp: , Rfl:  .  celecoxib (CELEBREX) 200 MG capsule, Take 200 mg by mouth daily. , Disp: , Rfl:  .  clobetasol cream (TEMOVATE) 0.05 %, Apply 1 application topically 2 (two) times daily., Disp: , Rfl:  .  cyclobenzaprine (FLEXERIL) 10 MG tablet, Take 10 mg by mouth at bedtime. , Disp: , Rfl:  .  fenofibrate (TRICOR) 145 MG tablet, Take 145 mg by mouth daily., Disp: , Rfl:  .  Fluticasone-Salmeterol (ADVAIR) 250-50 MCG/DOSE AEPB, Inhale 1 puff into the lungs 2 (two) times daily., Disp: , Rfl:  .  HYDROcodone-acetaminophen (NORCO/VICODIN) 5-325 MG tablet, Take 1 tablet by mouth 3 (three) times daily as needed for moderate pain. , Disp: , Rfl:  .  hydrocortisone (ANUSOL-HC) 2.5 % rectal cream, Place 1 application rectally 2 (two) times daily. (Patient not taking: Reported on 09/15/2017), Disp: 30 g, Rfl: 1 .  IBUPROFEN PO, Take by mouth., Disp: , Rfl:  .  Icosapent Ethyl  (VASCEPA PO), Take by mouth., Disp: , Rfl:  .  omeprazole (PRILOSEC) 20 MG capsule, Take 20 mg by mouth daily., Disp: , Rfl:  .  rosuvastatin (CRESTOR) 20 MG tablet, Take 20 mg by mouth 2 (two) times daily., Disp: , Rfl:  .  umeclidinium-vilanterol (ANORO ELLIPTA) 62.5-25 MCG/INH AEPB, Inhale 1 puff into the lungs daily., Disp: , Rfl:    SLEEP ARCHITECTURE Patient was studied for 445.5 minutes. The sleep efficiency was 92.8 % and the patient was supine for 7.7%. The arousal index was 0.0 per hour.  RESPIRATORY PARAMETERS The overall AHI was 0.0 per hour, with a central apnea index of 0.0 per hour.  The oxygen nadir was 90% during sleep.     CARDIAC DATA Mean heart rate during sleep was 72.4 bpm.  IMPRESSIONS No significant obstructive sleep apnea occurred during this study (AHI = 0.0/h). No significant central sleep apnea occurred during this study (CAI = 0.0/h).    Argie Ramming, MD Diplomate, American Board of Sleep Medicine.  ELECTRONICALLY SIGNED ON:  05/16/2020, 9:44 AM [facility] PH: [phone]   FX: [phone] ACCREDITED BY THE AMERICAN ACADEMY OF SLEEP MEDICINE

## 2020-08-15 ENCOUNTER — Encounter: Payer: Self-pay | Admitting: Internal Medicine

## 2020-08-15 ENCOUNTER — Ambulatory Visit: Payer: Medicare Other | Admitting: Internal Medicine

## 2020-08-15 ENCOUNTER — Other Ambulatory Visit: Payer: Self-pay

## 2020-08-15 VITALS — BP 142/82 | HR 83 | Temp 97.7°F | Ht 72.0 in | Wt 214.0 lb

## 2020-08-15 DIAGNOSIS — J449 Chronic obstructive pulmonary disease, unspecified: Secondary | ICD-10-CM | POA: Diagnosis not present

## 2020-08-15 MED ORDER — TRELEGY ELLIPTA 100-62.5-25 MCG/INH IN AEPB: 1.0000 | INHALATION_SPRAY | Freq: Every day | RESPIRATORY_TRACT | 0 refills | Status: DC

## 2020-08-15 MED ORDER — BREZTRI AEROSPHERE 160-9-4.8 MCG/ACT IN AERO: 2.0000 | INHALATION_SPRAY | Freq: Two times a day (BID) | RESPIRATORY_TRACT | 0 refills | Status: DC

## 2020-08-15 MED ORDER — METHYLPREDNISOLONE ACETATE 80 MG/ML IJ SUSP
120.0000 mg | Freq: Once | INTRAMUSCULAR | Status: AC
  Administered 2020-08-15: 120 mg via INTRAMUSCULAR

## 2020-08-15 NOTE — Assessment & Plan Note (Addendum)
Quit smoking 07/2019  - PFT's  02/27/20  FEV1 2.55 (63 % ) ratio 0.62  p 6 % improvement from saba p ? prior to study with DLCO  24.81 (81%) corrects to 3.46 (80 %)  for alv volume and FV curve classically concave  - 08/15/2020  After extensive coaching inhaler device,  effectiveness =    85% > try breztri 2bid samples x 2 weeks then trelegy x 2 weeks then return for re-eval  Response to prednisone and overuse of saba are strong indicators  Group D in terms of symptom/risk and laba/lama/ICS  therefore appropriate rx at this point >>>  breztri vs trelgy trial plus prn saba  I spent extra time with pt today reviewing appropriate use of albuterol for prn use on exertion with the following points: 1) saba is for relief of sob that does not improve by walking a slower pace or resting but rather if the pt does not improve after trying this first. 2) If the pt is convinced, as many are, that saba helps recover from activity faster then it's easy to tell if this is the case by re-challenging : ie stop, take the inhaler, then p 5 minutes try the exact same activity (intensity of workload) that just caused the symptoms and see if they are substantially diminished or not after saba 3) if there is an activity that reproducibly causes the symptoms, try the saba 15 min before the activity on alternate days   If in fact the saba really does help, then fine to continue to use it prn but advised may need to look closer at the maintenance regimen being used to achieve better control of airways disease with exertion.          Each maintenance medication was reviewed in detail including emphasizing most importantly the difference between maintenance and prns and under what circumstances the prns are to be triggered using an action plan format where appropriate.  Total time for H and P, chart review, counseling, reviewing hfa/ elipta device(s) and generating customized AVS unique to this office visit / same day charting =  40 min

## 2020-08-15 NOTE — Patient Instructions (Addendum)
Plan A = Automatic = Always=  Breztri Take 2 puffs first thing in am and then another 2 puffs about 12 hours later.   Work on inhaler technique:  relax and gently blow all the way out then take a nice smooth deep breath back in, triggering the inhaler at same time you start breathing in.  Hold for up to 5 seconds if you can. Blow out thru nose. Rinse and gargle with water when done - arm and hammer is the best toothpaste  After you use up the breztri,  Change to Trelegy one click each am  Depomedrol 120 mg IM today    Plan B = Backup (to supplement plan A, not to replace it) Only use your albuterol inhaler as a rescue medication to be used if you can't catch your breath by resting or doing a relaxed purse lip breathing pattern.  - The less you use it, the better it will work when you need it. - Ok to use the inhaler up to 2 puffs  every 4 hours if you must but call for appointment if use goes up over your usual need - Don't leave home without it !!  (think of it like the spare tire for your car)     I spent extra time with pt today reviewing appropriate use of albuterol for prn use on exertion with the following points: 1) saba is for relief of sob that does not improve by walking a slower pace or resting but rather if the pt does not improve after trying this first. 2) If the pt is convinced, as many are, that saba helps recover from activity faster then it's easy to tell if this is the case by re-challenging : ie stop, take the inhaler, then p 5 minutes try the exact same activity (intensity of workload) that just caused the symptoms and see if they are substantially diminished or not after saba 3) if there is an activity that reproducibly causes the symptoms, try the saba 15 min before the activity on alternate days   If in fact the saba really does help, then fine to continue to use it prn but advised may need to look closer at the maintenance regimen being used to achieve better control of  airways disease with exertion.   Please schedule a follow up office visit in 4 weeks, sooner if needed     Plan C = Crisis (instead of Plan B but only if Plan B stops working) - only use your albuterol nebulizer (8 x stronger) if you first try Plan B and it fails to help > ok to use the nebulizer up to every 4 hours but if start needing it regularly call for immediate appointment    Please schedule a follow up office visit in 4 weeks, sooner if needed

## 2020-08-15 NOTE — Progress Notes (Signed)
Bobby Gonzales, male    DOB: [birth year]    MRN: [id number]   Brief patient profile:  54 yowm quit smoking 07/2019 @ wt 196  with onset of doe @ 2009 assoc wheeze >  Better on advair but wore off after a few years and then changed to breztri but no better so referred to pulmonary clinic in [place]  08/15/2020 by Dr  Scharlene Gloss office with GOLD II criteria 02/2020    History of Present Illness  08/15/2020  Pulmonary/ 1st office eval/ [name] / [place] Office re GOLD II copd  Chief Complaint  Patient presents with  . Consult    Shortness of breath with activity since July 2021  Dyspnea:  MMRC2 = can't walk a nl pace on a flat grade s sob but does fine slow and flat  - legs often stop him first/ prednisone always helps Cough: at times  Sleep: on side / bed is flat  SABA use: saba  Up to 3-4 x per day p ex/ neb bid   No obvious patterns in day to day or daytime variability or assoc excess/ purulent sputum or mucus plugs or hemoptysis or cp or chest tightness, subjective wheeze or overt sinus or hb symptoms.   Sleeping  without nocturnal  or early am exacerbation  of respiratory  c/o's or need for noct saba. Also denies any obvious fluctuation of symptoms with weather or environmental changes or other aggravating or alleviating factors except as outlined above   No unusual exposure hx or h/o childhood pna/ asthma or knowledge of premature birth.  Current Allergies, Complete Past Medical History, Past Surgical History, Family History, and Social History were reviewed in [employer] record.  ROS  The following are not active complaints unless bolded Hoarseness, sore throat, dysphagia, dental problems, itching, sneezing,  nasal congestion or discharge of excess mucus or purulent secretions, ear ache,   fever, chills, sweats, unintended wt loss or wt gain, classically pleuritic or exertional cp,  orthopnea pnd or arm/hand swelling  or leg swelling, presyncope, palpitations,  abdominal pain, anorexia, nausea, vomiting, diarrhea  or change in bowel habits or change in bladder habits, change in stools or change in urine, dysuria, hematuria,  rash, arthralgias, visual complaints, headache, numbness, weakness or ataxia or problems with walking or coordination,  change in mood or  memory.           Past Medical History:  Diagnosis Date  . Anxiety   . COPD (chronic obstructive pulmonary disease) (HCC)   . Depression   . Hypercholesterolemia   . Insomnia   . Jock itch     Outpatient Medications Prior to Visit  Medication Sig Dispense Refill  . albuterol (PROVENTIL) (2.5 MG/3ML) 0.083% nebulizer solution Take 2.5 mg by nebulization every 6 (six) hours as needed for wheezing or shortness of breath.    Marland Kitchen albuterol (VENTOLIN HFA) 108 (90 Base) MCG/ACT inhaler Inhale 1-2 puffs into the lungs every 6 (six) hours as needed for wheezing or shortness of breath.    . ALPRAZolam (XANAX) 1 MG tablet Take 1 mg by mouth 2 (two) times daily.    Marland Kitchen aspirin EC 81 MG tablet Take 81 mg by mouth daily. Swallow whole.    . betamethasone dipropionate (DIPROLENE) 0.05 % cream Apply topically 2 (two) times daily.    . Budeson-Glycopyrrol-Formoterol (BREZTRI AEROSPHERE) 160-9-4.8 MCG/ACT AERO Inhale 2 puffs into the lungs daily.    . celecoxib (CELEBREX) 200 MG capsule Take 200 mg by  mouth daily.     . clobetasol cream (TEMOVATE) 0.05 % Apply 1 application topically 2 (two) times daily.    . furosemide (LASIX) 20 MG tablet Take 20 mg by mouth daily.    Marland Kitchen omeprazole (PRILOSEC) 40 MG capsule Take 40 mg by mouth daily.    . predniSONE (DELTASONE) 10 MG tablet Take 10 mg by mouth daily with breakfast. Takes 1 tablet Daily PRN    . rosuvastatin (CRESTOR) 20 MG tablet Take 20 mg by mouth 2 (two) times daily.    . traZODone (DESYREL) 150 MG tablet Take 150 mg by mouth at bedtime.    . cyclobenzaprine (FLEXERIL) 10 MG tablet Take 10 mg by mouth at bedtime.  (Patient not taking: Reported on  08/15/2020)    . fenofibrate (TRICOR) 145 MG tablet Take 145 mg by mouth daily. (Patient not taking: Reported on 08/15/2020)    .       Marland Kitchen HYDROcodone-acetaminophen (NORCO/VICODIN) 5-325 MG tablet Take 1 tablet by mouth 3 (three) times daily as needed for moderate pain.  (Patient not taking: Reported on 08/15/2020)    . hydrocortisone (ANUSOL-HC) 2.5 % rectal cream Place 1 application rectally 2 (two) times daily. (Patient not taking: No sig reported) 30 g 1  . IBUPROFEN PO Take by mouth.    Bess Harvest Ethyl (VASCEPA PO) Take by mouth. (Patient not taking: Reported on 08/15/2020)    . omeprazole (PRILOSEC) 20 MG capsule Take 20 mg by mouth daily.    .           Objective:     BP (!) 142/82 (BP Location: Left Arm, Cuff Size: Normal)   Pulse 83   Temp 97.7 F (36.5 C) (Other (Comment)) Comment (Src): wrist  Ht 6' (1.829 m)   Wt 214 lb (97.1 kg)   SpO2 94% Comment: Room air  BMI 29.02 kg/m   SpO2: 94 % (Room air)    HEENT : pt wearing mask not removed for exam due to covid - 19 concerns.    NECK :  without JVD/Nodes/TM/ nl carotid upstrokes bilaterally   LUNGS: no acc muscle use,  Mild barrel  contour chest wall with bilateral  Distant bs s audible wheeze and  without cough on insp or exp maneuvers  and mild  Hyperresonant  to  percussion bilaterally     CV:  RRR  no s3 or murmur or increase in P2, and no edema   ABD:  soft and nontender with pos end  insp Hoover's  in the supine position. No bruits or organomegaly appreciated, bowel sounds nl  MS:   Nl gait/  ext warm without deformities, calf tenderness, cyanosis or clubbing No obvious joint restrictions   SKIN: warm and dry without lesions    NEURO:  alert, approp, nl sensorium with  no motor or cerebellar deficits apparent.        I personally reviewed images and agree with radiology impression as follows:  CXR:   03/13/20 Hyperinflation without acute finding.    Assessment   COPD GOLD II Quit smoking 07/2019  -  PFT's  02/27/20  FEV1 2.55 (63 % ) ratio 0.62  p 6 % improvement from saba p ? prior to study with DLCO  24.81 (81%) corrects to 3.46 (80 %)  for alv volume and FV curve classically concave  - 08/15/2020  After extensive coaching inhaler device,  effectiveness =    85% > try breztri 2bid samples x 2 weeks then trelegy  x 2 weeks then return for re-eval  Response to prednisone and overuse of saba are strong indicators  Group D in terms of symptom/risk and laba/lama/ICS  therefore appropriate rx at this point >>>  breztri vs trelgy trial plus prn saba  I spent extra time with pt today reviewing appropriate use of albuterol for prn use on exertion with the following points: 1) saba is for relief of sob that does not improve by walking a slower pace or resting but rather if the pt does not improve after trying this first. 2) If the pt is convinced, as many are, that saba helps recover from activity faster then it's easy to tell if this is the case by re-challenging : ie stop, take the inhaler, then p 5 minutes try the exact same activity (intensity of workload) that just caused the symptoms and see if they are substantially diminished or not after saba 3) if there is an activity that reproducibly causes the symptoms, try the saba 15 min before the activity on alternate days   If in fact the saba really does help, then fine to continue to use it prn but advised may need to look closer at the maintenance regimen being used to achieve better control of airways disease with exertion.          Each maintenance medication was reviewed in detail including emphasizing most importantly the difference between maintenance and prns and under what circumstances the prns are to be triggered using an action plan format where appropriate.  Total time for H and P, chart review, counseling, reviewing hfa/ elipta device(s) and generating customized AVS unique to this office visit / same day charting = 40 min               Sandrea Hughs, MD 08/15/2020

## 2020-09-02 DIAGNOSIS — E7849 Other hyperlipidemia: Secondary | ICD-10-CM | POA: Diagnosis not present

## 2020-09-02 DIAGNOSIS — K219 Gastro-esophageal reflux disease without esophagitis: Secondary | ICD-10-CM | POA: Diagnosis not present

## 2020-09-02 DIAGNOSIS — J441 Chronic obstructive pulmonary disease with (acute) exacerbation: Secondary | ICD-10-CM | POA: Diagnosis not present

## 2020-09-10 ENCOUNTER — Encounter: Payer: Self-pay | Admitting: Internal Medicine

## 2020-09-10 ENCOUNTER — Other Ambulatory Visit: Payer: Self-pay

## 2020-09-10 ENCOUNTER — Ambulatory Visit: Payer: Medicare Other | Admitting: Internal Medicine

## 2020-09-10 DIAGNOSIS — J449 Chronic obstructive pulmonary disease, unspecified: Secondary | ICD-10-CM | POA: Diagnosis not present

## 2020-09-10 MED ORDER — FAMOTIDINE 20 MG PO TABS: ORAL_TABLET | ORAL | 11 refills | Status: DC

## 2020-09-10 MED ORDER — BREZTRI AEROSPHERE 160-9-4.8 MCG/ACT IN AERO: 2.0000 | INHALATION_SPRAY | Freq: Every day | RESPIRATORY_TRACT | 11 refills | Status: AC

## 2020-09-10 MED ORDER — BREZTRI AEROSPHERE 160-9-4.8 MCG/ACT IN AERO: 2.0000 | INHALATION_SPRAY | Freq: Two times a day (BID) | RESPIRATORY_TRACT | 0 refills | Status: DC

## 2020-09-10 NOTE — Assessment & Plan Note (Addendum)
Quit smoking 07/2019  - PFT's  02/27/20  FEV1 2.55 (63 % ) ratio 0.62  p 6 % improvement from saba p ? prior to study with DLCO  24.81 (81%) corrects to 3.46 (80 %)  for alv volume and FV curve classically concave  - 08/15/2020  After extensive coaching inhaler device,  effectiveness =    85% > try breztri 2bid samples x 2 weeks then trelegy x 2 weeks then return for re-eval   Group D in terms of symptom/risk and laba/lama/ICS  therefore appropriate rx at this point >>>  breztri Take 2 puffs first thing in am and then another 2 puffs about 12 hours later.  -- may be able to taper to one bid since more limited by hips than sob and may not be able to afford the 2bid rx   Advised re covid: Patient is unvaccinated and was informed of the seriousness of COVID 19 infection as a direct risk to lung health as well as safety to close contacts and should continue to wear a facemask in public and minimize exposure to public locations but especially avoid any area or activity where non-close contacts are not observing distancing or wearing an appropriate face mask.  I strongly recommended pt take either of the vaccines available through local drugstores based on updated information on millions of Americans treated with the Moderna and [employer] products  which have proven both safe and  effective even against the  delta and new omicron variant to prevent hospitalization and death.   Advised re LDCT : Low-dose CT lung cancer screening is recommended for patients who are 72-13 years of age with a 30+ pack-year history of smoking, and who are currently smoking or quit <=15 years ago.   >>> declined    F/u 35m sooner prn           Each maintenance medication was reviewed in detail including emphasizing most importantly the difference between maintenance and prns and under what circumstances the prns are to be triggered using an action plan format where appropriate.  Total time for H and P, chart review,  counseling, reviewing hfa  device(s) and generating customized AVS unique to this office visit / same day charting  > 30 min

## 2020-09-10 NOTE — Progress Notes (Signed)
Bobby Gonzales, male    DOB: 1965-10-22    MRN: [id number]   Brief patient profile:  54 yowm quit smoking 07/2019 @ wt 196  with onset of doe @ 2009 assoc wheeze >  Better on advair but wore off after a few years and then changed to breztri but no better so referred to pulmonary clinic in [place]  08/15/2020 by Dr  Scharlene Gloss office with GOLD II criteria 02/2020    History of Present Illness  08/15/2020  Pulmonary/ 1st office eval/ Bobby Gonzales / [place] [facility] re GOLD II copd  Chief Complaint  Patient presents with  . Consult    Shortness of breath with activity since July 2021  Dyspnea:  MMRC2 = can't walk a nl pace on a flat grade s sob but does fine slow and flat  - legs often stop him first/ prednisone always helps Cough: at times  Sleep: on side / bed is flat  SABA use: saba  Up to 3-4 x per day p ex/ neb bid  rec Plan A = Automatic = Always=  Breztri Take 2 puffs first thing in am and then another 2 puffs about 12 hours later.  Work on inhaler technique:   After you use up the breztri,  Change to Trelegy one click each am Depomedrol 120 mg IM today  Plan B = Backup (to supplement plan A, not to replace it) Only use your albuterol inhaler as a rescue medication  Plan C = Crisis (instead of Plan B but only if Plan B stops working) - only use your albuterol nebulizer (8 x stronger) if you first try Plan B and it fails to help > ok to use the nebulizer up to every 4 hours but if start needing it regularly call for immediate appointment   09/10/2020  f/u ov/[place] office/Bobby Gonzales re: GOLD II copd on breztri trial Chief Complaint  Patient presents with  . Followup     Breathing is unchanged since the last visit. He has not used his albuterol inhaler or neb. He tried the trelegy and stopped due to nausea.   Dyspnea:  Walks x 15 min s stopping but stops due to hips  Cough: mucus changed to dark green / no worse in am  Sleeping: bed is flat  Sleeps on side / 2 pillows SABA use: none  02:  none  Covid status: no vaccination / had alpha covid  Lung cancer screening: not interested    No obvious day to day or daytime variability or assoc excess/ purulent sputum or mucus plugs or hemoptysis or cp or chest tightness, subjective wheeze or overt sinus or hb symptoms.   Sleeps  without nocturnal  or early am exacerbation  of respiratory  c/o's or need for noct saba. Also denies any obvious fluctuation of symptoms with weather or environmental changes or other aggravating or alleviating factors except as outlined above   No unusual exposure hx or h/o childhood pna/ asthma or knowledge of premature birth.  Current Allergies, Complete Past Medical History, Past Surgical History, Family History, and Social History were reviewed in [employer] record.  ROS  The following are not active complaints unless bolded Hoarseness, sore throat, dysphagia, dental problems, itching, sneezing,  nasal congestion or discharge of excess mucus or purulent secretions, ear ache,   fever, chills, sweats, unintended wt loss or wt gain, classically pleuritic or exertional cp,  orthopnea pnd or arm/hand swelling  or leg swelling, presyncope, palpitations, abdominal pain, anorexia,  nausea, vomiting, diarrhea  or change in bowel habits or change in bladder habits, change in stools or change in urine, dysuria, hematuria,  rash, arthralgias, visual complaints, headache, numbness, weakness or ataxia or problems with walking or coordination,  change in mood or  memory.        Current Meds  Medication Sig  . albuterol (PROVENTIL) (2.5 MG/3ML) 0.083% nebulizer solution Take 2.5 mg by nebulization every 6 (six) hours as needed for wheezing or shortness of breath.  Marland Kitchen albuterol (VENTOLIN HFA) 108 (90 Base) MCG/ACT inhaler Inhale 1-2 puffs into the lungs every 6 (six) hours as needed for wheezing or shortness of breath.  . ALPRAZolam (XANAX) 1 MG tablet Take 1 mg by mouth 2 (two) times daily.  Marland Kitchen aspirin  EC 81 MG tablet Take 81 mg by mouth daily. Swallow whole.  . betamethasone dipropionate (DIPROLENE) 0.05 % cream Apply topically 2 (two) times daily.  . Budeson-Glycopyrrol-Formoterol (BREZTRI AEROSPHERE) 160-9-4.8 MCG/ACT AERO Inhale 2 puffs into the lungs daily.  . celecoxib (CELEBREX) 200 MG capsule Take 200 mg by mouth daily.   . clobetasol cream (TEMOVATE) 0.05 % Apply 1 application topically 2 (two) times daily.  . furosemide (LASIX) 20 MG tablet Take 20 mg by mouth daily.  Marland Kitchen omeprazole (PRILOSEC) 40 MG capsule Take 40 mg by mouth daily.  . rosuvastatin (CRESTOR) 20 MG tablet Take 20 mg by mouth 2 (two) times daily.  . traZODone (DESYREL) 150 MG tablet Take 150 mg by mouth at bedtime.           Past Medical History:  Diagnosis Date  . Anxiety   . COPD (chronic obstructive pulmonary disease) (HCC)   . Depression   . Hypercholesterolemia   . Insomnia   . Jock itch        Objective:    Wt Readings from Last 3 Encounters:  09/10/20 215 lb (97.5 kg)  08/15/20 214 lb (97.1 kg)  09/15/17 216 lb (98 kg)      Vital signs reviewed  09/10/2020  - Note at rest 02 sats  96% on RA   General appearance:    amb wm nad      HEENT : pt wearing mask not removed for exam due to covid - 19 concerns.    NECK :  without JVD/Nodes/TM/ nl carotid upstrokes bilaterally   LUNGS: no acc muscle use,  Mild barrel  contour chest wall with bilateral  Distant bs s audible wheeze and  without cough on insp or exp maneuvers  and mild  Hyperresonant  to  percussion bilaterally     CV:  RRR  no s3 or murmur or increase in P2, and no edema   ABD:  soft and nontender with pos end  insp Hoover's  in the supine position. No bruits or organomegaly appreciated, bowel sounds nl  MS:   Nl gait/  ext warm without deformities, calf tenderness, cyanosis or clubbing No obvious joint restrictions   SKIN: warm and dry with xerotic eczematous rash R forearm    NEURO:  alert, approp, nl sensorium with  no  motor or cerebellar deficits apparent.                Assessment

## 2020-09-10 NOTE — Patient Instructions (Signed)
I very strongly recommend you get the moderna or pfizer vaccine as soon as possible based on your risk of dying from the virus  and the proven safety and benefit of these vaccines against even the delta and omicron variants.  This can save your life as well as  those of your loved ones,  especially if they are also not vaccinated.     Try change the breztri to one twice daily if do just as well   Change omeprazole to pepcid (famotidine) 20 mg at / after breakfast and supper for at least a couple of week but if still itching the same after 2 weeks then see Dr Margo Aye   The best lotion for itching is called Dermarest Ezcema lotion    Please schedule a follow up visit in 6  months but call sooner if needed

## 2020-09-10 NOTE — Addendum Note (Signed)
Addended by: Christen Butter on: 09/10/2020 09:25 AM   Modules accepted: Orders

## 2020-09-17 ENCOUNTER — Telehealth: Payer: Self-pay | Admitting: *Deleted

## 2020-09-17 NOTE — Telephone Encounter (Signed)
PA initiated via [facility]  Key:  [id number] DOB:  1966-01-20 Name:  Bobby Gonzales  Will forward to LR to follow up on PA.

## 2020-09-19 NOTE — Telephone Encounter (Signed)
Received letter from [facility] today and stated that the PA initiated for Bobby Gonzales has been cancelled due to this medication is on the plan's list of covered drugs. PA is not required at this time. Will sign off

## 2020-10-17 DIAGNOSIS — R7303 Prediabetes: Secondary | ICD-10-CM | POA: Diagnosis not present

## 2020-10-17 DIAGNOSIS — M150 Primary generalized (osteo)arthritis: Secondary | ICD-10-CM | POA: Diagnosis not present

## 2020-10-17 DIAGNOSIS — E782 Mixed hyperlipidemia: Secondary | ICD-10-CM | POA: Diagnosis not present

## 2020-10-17 DIAGNOSIS — E785 Hyperlipidemia, unspecified: Secondary | ICD-10-CM | POA: Diagnosis not present

## 2020-10-17 DIAGNOSIS — M1990 Unspecified osteoarthritis, unspecified site: Secondary | ICD-10-CM | POA: Diagnosis not present

## 2020-10-17 DIAGNOSIS — R7301 Impaired fasting glucose: Secondary | ICD-10-CM | POA: Diagnosis not present

## 2020-10-23 DIAGNOSIS — B370 Candidal stomatitis: Secondary | ICD-10-CM | POA: Diagnosis not present

## 2020-10-23 DIAGNOSIS — G4700 Insomnia, unspecified: Secondary | ICD-10-CM | POA: Diagnosis not present

## 2020-10-23 DIAGNOSIS — R7301 Impaired fasting glucose: Secondary | ICD-10-CM | POA: Diagnosis not present

## 2020-10-23 DIAGNOSIS — R4702 Dysphasia: Secondary | ICD-10-CM | POA: Diagnosis not present

## 2020-10-23 DIAGNOSIS — R7303 Prediabetes: Secondary | ICD-10-CM | POA: Diagnosis not present

## 2020-10-23 DIAGNOSIS — K219 Gastro-esophageal reflux disease without esophagitis: Secondary | ICD-10-CM | POA: Diagnosis not present

## 2020-10-23 DIAGNOSIS — E782 Mixed hyperlipidemia: Secondary | ICD-10-CM | POA: Diagnosis not present

## 2020-10-23 DIAGNOSIS — J449 Chronic obstructive pulmonary disease, unspecified: Secondary | ICD-10-CM | POA: Diagnosis not present

## 2020-10-23 DIAGNOSIS — M5030 Other cervical disc degeneration, unspecified cervical region: Secondary | ICD-10-CM | POA: Diagnosis not present

## 2020-11-18 DIAGNOSIS — E1165 Type 2 diabetes mellitus with hyperglycemia: Secondary | ICD-10-CM | POA: Diagnosis not present

## 2020-11-18 DIAGNOSIS — E782 Mixed hyperlipidemia: Secondary | ICD-10-CM | POA: Diagnosis not present

## 2020-11-18 DIAGNOSIS — M1990 Unspecified osteoarthritis, unspecified site: Secondary | ICD-10-CM | POA: Diagnosis not present

## 2020-12-03 DIAGNOSIS — R309 Painful micturition, unspecified: Secondary | ICD-10-CM | POA: Diagnosis not present

## 2020-12-03 DIAGNOSIS — N390 Urinary tract infection, site not specified: Secondary | ICD-10-CM | POA: Diagnosis not present

## 2020-12-03 DIAGNOSIS — R339 Retention of urine, unspecified: Secondary | ICD-10-CM | POA: Diagnosis not present

## 2021-01-02 DIAGNOSIS — E1165 Type 2 diabetes mellitus with hyperglycemia: Secondary | ICD-10-CM | POA: Diagnosis not present

## 2021-01-02 DIAGNOSIS — M1990 Unspecified osteoarthritis, unspecified site: Secondary | ICD-10-CM | POA: Diagnosis not present

## 2021-01-28 DIAGNOSIS — R609 Edema, unspecified: Secondary | ICD-10-CM | POA: Diagnosis not present

## 2021-01-28 DIAGNOSIS — R58 Hemorrhage, not elsewhere classified: Secondary | ICD-10-CM | POA: Diagnosis not present

## 2021-03-05 DIAGNOSIS — E1165 Type 2 diabetes mellitus with hyperglycemia: Secondary | ICD-10-CM | POA: Diagnosis not present

## 2021-03-05 DIAGNOSIS — M1990 Unspecified osteoarthritis, unspecified site: Secondary | ICD-10-CM | POA: Diagnosis not present

## 2021-03-07 DIAGNOSIS — M79642 Pain in left hand: Secondary | ICD-10-CM | POA: Diagnosis not present

## 2021-03-07 DIAGNOSIS — W540XXA Bitten by dog, initial encounter: Secondary | ICD-10-CM | POA: Diagnosis not present

## 2021-03-26 ENCOUNTER — Encounter: Payer: Self-pay | Admitting: Internal Medicine

## 2021-03-26 ENCOUNTER — Other Ambulatory Visit: Payer: Self-pay

## 2021-03-26 ENCOUNTER — Ambulatory Visit: Payer: Medicare Other | Admitting: Internal Medicine

## 2021-03-26 DIAGNOSIS — J449 Chronic obstructive pulmonary disease, unspecified: Secondary | ICD-10-CM | POA: Diagnosis not present

## 2021-03-26 DIAGNOSIS — R079 Chest pain, unspecified: Secondary | ICD-10-CM | POA: Diagnosis not present

## 2021-03-26 MED ORDER — METOPROLOL TARTRATE 25 MG PO TABS: 25.0000 mg | ORAL_TABLET | Freq: Two times a day (BID) | ORAL | 11 refills | Status: DC

## 2021-03-26 NOTE — Assessment & Plan Note (Signed)
Quit smoking 07/2019  - PFT's  02/27/20  FEV1 2.55 (63 % ) ratio 0.62  p 6 % improvement from saba p ? prior to study with DLCO  24.81 (81%) corrects to 3.46 (80 %)  for alv volume and FV curve classically concave  - 08/15/2020  After extensive coaching inhaler device,  effectiveness =    85% > try breztri 2bid samples x 2 weeks then trelegy x 2 weeks then return for re-eval - 09/10/2020  After extensive coaching inhaler device,  effectiveness =    95%   Group D in terms of symptom/risk and laba/lama/ICS  therefore appropriate rx at this point >>>  Continue breztri 2bid with prn saba  Re saba: I spent extra time with pt today reviewing appropriate use of albuterol for prn use on exertion with the following points: 1) saba is for relief of sob that does not improve by walking a slower pace or resting but rather if the pt does not improve after trying this first. 2) If the pt is convinced, as many are, that saba helps recover from activity faster then it's easy to tell if this is the case by re-challenging : ie stop, take the inhaler, then p 5 minutes try the exact same activity (intensity of workload) that just caused the symptoms and see if they are substantially diminished or not after saba 3) if there is an activity that reproducibly causes the symptoms, try the saba 15 min before the activity on alternate days   If in fact the saba really does help, then fine to continue to use it prn but advised may need to look closer at the maintenance regimen being used to achieve better control of airways disease with exertion.

## 2021-03-26 NOTE — Patient Instructions (Addendum)
Lopressor 25 mg twice daily until you see a heart doctor and continue your aspirin daily   We will be referring you to [facility]  - go to ER if chest pain does not go away at rest or happens with less and less exertion.   Plan A = Automatic = Always=  Breztri Take 2 puffs first thing in am and then another 2 puffs about 12 hours later.   Plan B = Backup (to supplement plan A, not to replace it) Only use your albuterol inhaler as a rescue medication to be used if you can't catch your breath by resting or doing a relaxed purse lip breathing pattern.  - The less you use it, the better it will work when you need it. - Ok to use the inhaler up to 2 puffs  every 4 hours if you must but call for appointment if use goes up over your usual need - Don't leave home without it !!  (think of it like the spare tire for your car)       Plan C = Crisis (instead of Plan B but only if Plan B stops working) - only use your albuterol nebulizer (8 x stronger) if you first try Plan B and it fails to help > ok to use the nebulizer up to every 4 hours but if start needing it regularly call for immediate appointment      Please schedule a follow up visit in 12 months but call sooner if needed

## 2021-03-26 NOTE — Progress Notes (Signed)
Bobby Gonzales, male    DOB: Jul 19, 1965    MRN: [id number]   Brief patient profile:  54 yowm quit smoking 07/2019 @ wt 196  with onset of doe @ 2009 assoc wheeze >  Better on advair but wore off after a few years and then changed to breztri but no better so referred to pulmonary clinic in [place]  08/15/2020 by Dr  Scharlene Gloss office with GOLD II criteria 02/2020     History of Present Illness  08/15/2020  Pulmonary/ 1st office eval/ [name] / [place] Office re GOLD II copd  Chief Complaint  Patient presents with   Consult    Shortness of breath with activity since July 2021  Dyspnea:  MMRC2 = can't walk a nl pace on a flat grade s sob but does fine slow and flat  - legs often stop him first/ prednisone always helps Cough: at times  Sleep: on side / bed is flat  SABA use: saba  Up to 3-4 x per day p ex/ neb bid  rec Plan A = Automatic = Always=  Breztri Take 2 puffs first thing in am and then another 2 puffs about 12 hours later.  Work on inhaler technique:   After you use up the breztri,  Change to Trelegy one click each am Depomedrol 120 mg IM today  Plan B = Backup (to supplement plan A, not to replace it) Only use your albuterol inhaler as a rescue medication  Plan C = Crisis (instead of Plan B but only if Plan B stops working) - only use your albuterol nebulizer (8 x stronger) if you first try Plan B and it fails to help > ok to use the nebulizer up to every 4 hours but if start needing it regularly call for immediate appointment   09/10/2020  f/u ov/[place] office/Annalycia Done re: GOLD II copd on breztri trial Chief Complaint  Patient presents with   Followup     Breathing is unchanged since the last visit. He has not used his albuterol inhaler or neb. He tried the trelegy and stopped due to nausea.   Dyspnea:  Walks x 15 min s stopping but stops due to hips  Cough: mucus changed to dark green / no worse in am  Sleeping: bed is flat  Sleeps on side / 2 pillows SABA use: none  02:  none  Covid status: no vaccination / had alpha covid  Lung cancer screening: not interested  Rec  I very strongly recommend you get the moderna or pfizer vaccine   Try change the breztri to one twice daily if do just as well  Change omeprazole to pepcid (famotidine) 20 mg at / after breakfast and supper for at least a couple of week but if still itching the same after 2 weeks then see Dr Margo Aye  The best lotion for itching is called Dermarest Ezcema lotion       03/26/2021  f/u ov/[place] office/Kenslie Abbruzzese re: GOLD  2 COPD maint on breztri 2 bid   Chief Complaint  Patient presents with   Follow-up    Pt says he has noticed pain around his collar bone and center of chest since last visit. No sob or cough to report   Dyspnea:  10 min medium pace/ slowed by hips  Cp with weed eating x 3 weeks x 10 min s nausea/ diphoresis or radiation on pepcid 20 mg bid  Cough: none  Sleeping: bed is flat, 2 pillows  SABA use: "you  told me not to use it" / no neb at all/ rarely saba 02: none  Covid status: never vax / original infection      No obvious day to day or daytime variability or assoc excess/ purulent sputum or mucus plugs or hemoptysis  or subjective wheeze or overt sinus or hb symptoms.   Sleeping  without nocturnal  or early am exacerbation  of respiratory  c/o's or need for noct saba. Also denies any obvious fluctuation of symptoms with weather or environmental changes or other aggravating or alleviating factors except as outlined above   No unusual exposure hx or h/o childhood pna/ asthma or knowledge of premature birth.  Current Allergies, Complete Past Medical History, Past Surgical History, Family History, and Social History were reviewed in [employer] record.  ROS  The following are not active complaints unless bolded Hoarseness, sore throat, dysphagia, dental problems, itching, sneezing,  nasal congestion or discharge of excess mucus or purulent secretions, ear  ache,   fever, chills, sweats, unintended wt loss or wt gain, classically pleuritic  cp,  orthopnea pnd or arm/hand swelling  or leg swelling, presyncope, palpitations, abdominal pain, anorexia, nausea, vomiting, diarrhea  or change in bowel habits or change in bladder habits, change in stools or change in urine, dysuria, hematuria,  rash, arthralgias, visual complaints, headache, numbness, weakness or ataxia or problems with walking or coordination,  change in mood or  memory.        Current Meds  Medication Sig   albuterol (PROVENTIL) (2.5 MG/3ML) 0.083% nebulizer solution Take 2.5 mg by nebulization every 6 (six) hours as needed for wheezing or shortness of breath.   albuterol (VENTOLIN HFA) 108 (90 Base) MCG/ACT inhaler Inhale 1-2 puffs into the lungs every 6 (six) hours as needed for wheezing or shortness of breath.   ALPRAZolam (XANAX) 1 MG tablet Take 1 mg by mouth 2 (two) times daily.   aspirin EC 81 MG tablet Take 81 mg by mouth daily. Swallow whole.   betamethasone dipropionate (DIPROLENE) 0.05 % cream Apply topically 2 (two) times daily.   Budeson-Glycopyrrol-Formoterol (BREZTRI AEROSPHERE) 160-9-4.8 MCG/ACT AERO Inhale 2 puffs into the lungs daily. Take 2 puffs first thing in am and then another 2 puffs about 12 hours later.   Budeson-Glycopyrrol-Formoterol (BREZTRI AEROSPHERE) 160-9-4.8 MCG/ACT AERO Inhale 2 puffs into the lungs 2 (two) times daily.   clobetasol cream (TEMOVATE) 0.05 % Apply 1 application topically 2 (two) times daily.   famotidine (PEPCID) 20 MG tablet One after breakfast and One after supper   fenofibrate (TRICOR) 145 MG tablet Take 145 mg by mouth daily.   furosemide (LASIX) 20 MG tablet Take 20 mg by mouth daily.   rosuvastatin (CRESTOR) 20 MG tablet Take 20 mg by mouth 2 (two) times daily.   traZODone (DESYREL) 150 MG tablet Take 150 mg by mouth at bedtime.   [DISCONTINUED] celecoxib (CELEBREX) 200 MG capsule Take 200 mg by mouth daily.               Past  Medical History:  Diagnosis Date   Anxiety    COPD (chronic obstructive pulmonary disease) (HCC)    Depression    Hypercholesterolemia    Insomnia    Jock itch        Objective:     03/26/2021      218   09/10/20 215 lb (97.5 kg)  08/15/20 214 lb (97.1 kg)  09/15/17 216 lb (98 kg)    Vital signs reviewed  03/26/2021  - Note at rest 02 sats  99% on RA   General appearance:    amb wm nad at rest     HEENT : pt wearing mask not removed for exam due to covid - 19 concerns.    NECK :  without JVD/Nodes/TM/ nl carotid upstrokes bilaterally   LUNGS: no acc muscle use,  Mild barrel  contour chest wall with bilateral  Distant bs s audible wheeze and  without cough on insp or exp maneuvers  and mild  Hyperresonant  to  percussion bilaterally     CV:  RRR  no s3 or murmur or increase in P2, and no edema   ABD:  soft and nontender with pos end  insp Hoover's  in the supine position. No bruits or organomegaly appreciated, bowel sounds nl  MS:   Nl gait/  ext warm without deformities, calf tenderness, cyanosis or clubbing No obvious joint restrictions   SKIN: warm and dry without lesions    NEURO:  alert, approp, nl sensorium with  no motor or cerebellar deficits apparent.        Ekg 03/26/2021  nsr / small q's in 2,3, AVF      Assessment

## 2021-03-26 NOTE — Assessment & Plan Note (Addendum)
Onset of was while weed eating first of Sept 2022 - 03/26/2021   Walked on RA x  3  lap(s) =  approx 450 @ fast  pace, stopped due to end of stuy s sob or CP  with lowest 02 sats 100%   - Ekg at rest 03/26/2021  Small non dx Inf qs -Added lopressor 25 mg twice daily 03/26/2021 >>>  - Referred to cardiology 03/26/2021 >>>   Advised on concern this likely represents new onset angina so needs cards eval asap and in meantime advised avoiding activities that precipitate cp while on low dose lopressor and baby asa.   If crescendo pattern > to ER   He declined rx for NTG   Each maintenance medication was reviewed in detail including emphasizing most importantly the difference between maintenance and prns and under what circumstances the prns are to be triggered using an action plan format where appropriate.  Total time for H and P, chart review, counseling, reviewing hfa device(s) , directly observing portions of ambulatory 02 saturation study/ and generating customized AVS unique to this office visit / same day charting   > 30 min

## 2021-04-07 ENCOUNTER — Encounter: Payer: Self-pay | Admitting: Cardiology

## 2021-04-07 ENCOUNTER — Other Ambulatory Visit (HOSPITAL_COMMUNITY)
Admission: RE | Admit: 2021-04-07 | Discharge: 2021-04-07 | Disposition: A | Discharge status: Home / Self Care | Payer: Medicare Other | Source: Ambulatory Visit | Attending: Cardiology | Admitting: Cardiology

## 2021-04-07 ENCOUNTER — Other Ambulatory Visit: Payer: Self-pay

## 2021-04-07 ENCOUNTER — Ambulatory Visit: Payer: Medicare Other | Admitting: Cardiology

## 2021-04-07 VITALS — BP 136/72 | HR 61 | Ht 72.0 in | Wt 221.0 lb

## 2021-04-07 DIAGNOSIS — R079 Chest pain, unspecified: Secondary | ICD-10-CM | POA: Insufficient documentation

## 2021-04-07 DIAGNOSIS — E785 Hyperlipidemia, unspecified: Secondary | ICD-10-CM | POA: Diagnosis not present

## 2021-04-07 DIAGNOSIS — I739 Peripheral vascular disease, unspecified: Secondary | ICD-10-CM

## 2021-04-07 DIAGNOSIS — R072 Precordial pain: Secondary | ICD-10-CM

## 2021-04-07 LAB — BASIC METABOLIC PANEL
Anion gap: 6 (ref 5–15)
BUN: 17 mg/dL (ref 6–20)
CO2: 29 mmol/L (ref 22–32)
Calcium: 9.1 mg/dL (ref 8.9–10.3)
Chloride: 100 mmol/L (ref 98–111)
Creatinine, Ser: 1.31 mg/dL — ABNORMAL HIGH (ref 0.61–1.24)
GFR, Estimated: >60 mL/min (ref >60)
Glucose, Bld: 87 mg/dL (ref 70–99)
Potassium: 4.4 mmol/L (ref 3.5–5.1)
Sodium: 135 mmol/L (ref 135–145)
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
## 2021-04-07 NOTE — Addendum Note (Signed)
Addended by: Theresia Majors on: 04/07/2021 02:35 PM   Modules accepted: Orders

## 2021-04-07 NOTE — Addendum Note (Signed)
Addended by: Theresia Majors on: 04/07/2021 03:21 PM   Modules accepted: Orders
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
## 2021-04-07 NOTE — Patient Instructions (Addendum)
Medication Instructions:  Your physician recommends that you continue on your current medications as directed. Please refer to the Current Medication list given to you today.  *If you need a refill on your cardiac medications before your next appointment, please call your pharmacy*   Lab Work: TODAY: BMET  If you have labs (blood work) drawn today and your tests are completely normal, you will receive your results only by: MyChart Message (if you have MyChart) OR A paper copy in the mail If you have any lab test that is abnormal or we need to change your treatment, we will call you to review the results.  Testing/Procedures: Your physician has requested that you have a lower extremity arterial duplex. During this test an ultrasound is used to evaluate arterial blood flow in the legs. Allow one hour for this exam. There are no restrictions or special instructions.  Your physician has requested that you have a coronary CTA scan. Please see below for further instructions.   Follow-Up: At [facility], you and your health needs are our priority.  As part of our continuing mission to provide you with exceptional heart care, we have created designated Provider Care Teams.  These Care Teams include your primary Cardiologist (physician) and Advanced Practice Providers (APPs -  Physician Assistants and Nurse Practitioners) who all work together to provide you with the care you need, when you need it.  Follow up with Dr. Mayford Knife based on results of testing.      Your cardiac CT will be scheduled at:  [facility] [address] [place], [place] [address] [phone]  Please arrive at the [facility] main entrance (entrance A) of [facility] 30 minutes prior to test start time. Proceed to the [facility] Radiology Department (first floor) to check-in and test prep.  Please follow these instructions carefully (unless otherwise directed):  Hold all erectile dysfunction  medications at least 3 days (72 hrs) prior to test.  On the Night Before the Test: Be sure to Drink plenty of water. Do not consume any caffeinated/decaffeinated beverages or chocolate 12 hours prior to your test. Do not take any antihistamines 12 hours prior to your test.  On the Day of the Test: Drink plenty of water until 1 hour prior to the test. Do not eat any food 4 hours prior to the test. You may take your regular medications prior to the test.  Take metoprolol (Lopressor) 50 mg two hours prior to test. HOLD Furosemide morning of the test.      After the Test: Drink plenty of water. After receiving IV contrast, you may experience a mild flushed feeling. This is normal. On occasion, you may experience a mild rash up to 24 hours after the test. This is not dangerous. If this occurs, you can take Benadryl 25 mg and increase your fluid intake. If you experience trouble breathing, this can be serious. If it is severe call 911 IMMEDIATELY. If it is mild, please call our office. If you take any of these medications: Glipizide/Metformin, Avandament, Glucavance, please do not take 48 hours after completing test unless otherwise instructed.  Please allow 2-4 weeks for scheduling of routine cardiac CTs. Some insurance companies require a pre-authorization which may delay scheduling of this test.   For non-scheduling related questions, please contact the cardiac imaging nurse navigator should you have any questions/concerns: Rockwell Alexandria, Cardiac Imaging Nurse Navigator Larey Brick, Cardiac Imaging Nurse Navigator [facility] Direct Office  Dial: [phone]   For scheduling needs, including cancellations and rescheduling, please call [place], [phone].

## 2021-04-07 NOTE — Addendum Note (Signed)
Addended by: Theresia Majors on: 04/07/2021 03:03 PM   Modules accepted: Orders

## 2021-04-08 ENCOUNTER — Telehealth: Payer: Self-pay

## 2021-04-08 DIAGNOSIS — R079 Chest pain, unspecified: Secondary | ICD-10-CM

## 2021-04-08 NOTE — Telephone Encounter (Signed)
The patient has been notified of the result and verbalized understanding.  All questions (if any) were answered. Theresia Majors, RN 04/08/2021 1:19 PM  Patient will stop taking Lasix and repeat labs on Friday

## 2021-04-08 NOTE — Telephone Encounter (Signed)
-----   Message from Quintella Reichert, MD sent at 04/08/2021  8:33 AM EDT ----- SCr bumped likely related to diuretics - stop lasix and repeat on Friday.  Hold on CT until we get repeat BMET

## 2021-04-10 ENCOUNTER — Other Ambulatory Visit: Payer: Self-pay

## 2021-04-10 ENCOUNTER — Other Ambulatory Visit: Payer: Self-pay | Admitting: Cardiology

## 2021-04-10 ENCOUNTER — Ambulatory Visit (HOSPITAL_COMMUNITY)
Admission: RE | Admit: 2021-04-10 | Discharge: 2021-04-10 | Disposition: A | Discharge status: Home / Self Care | Payer: Medicare Other | Source: Ambulatory Visit | Attending: Cardiology | Admitting: Cardiology

## 2021-04-10 DIAGNOSIS — I739 Peripheral vascular disease, unspecified: Secondary | ICD-10-CM | POA: Insufficient documentation

## 2021-04-10 DIAGNOSIS — I70213 Atherosclerosis of native arteries of extremities with intermittent claudication, bilateral legs: Secondary | ICD-10-CM | POA: Diagnosis not present

## 2021-04-11 ENCOUNTER — Other Ambulatory Visit (HOSPITAL_COMMUNITY)
Admission: RE | Admit: 2021-04-11 | Discharge: 2021-04-11 | Disposition: A | Discharge status: Home / Self Care | Payer: Medicare Other | Source: Ambulatory Visit | Attending: Cardiology | Admitting: Cardiology

## 2021-04-11 ENCOUNTER — Telehealth (HOSPITAL_COMMUNITY): Payer: Self-pay | Admitting: Emergency Medicine

## 2021-04-11 DIAGNOSIS — R079 Chest pain, unspecified: Secondary | ICD-10-CM | POA: Diagnosis not present

## 2021-04-11 LAB — BASIC METABOLIC PANEL
Anion gap: 6 (ref 5–15)
BUN: 16 mg/dL (ref 6–20)
CO2: 28 mmol/L (ref 22–32)
Calcium: 9.1 mg/dL (ref 8.9–10.3)
Chloride: 104 mmol/L (ref 98–111)
Creatinine, Ser: 1.27 mg/dL — ABNORMAL HIGH (ref 0.61–1.24)
GFR, Estimated: >60 mL/min (ref >60)
Glucose, Bld: 107 mg/dL — ABNORMAL HIGH (ref 70–99)
Potassium: 4.4 mmol/L (ref 3.5–5.1)
Sodium: 138 mmol/L (ref 135–145)

## 2021-04-11 NOTE — Telephone Encounter (Signed)
Reaching out to patient to offer assistance regarding upcoming cardiac imaging study; pt verbalizes understanding of appt date/time, parking situation and where to check in, pre-test NPO status and medications ordered, and verified current allergies; name and call back number provided for further questions should they arise Rockwell Alexandria RN Navigator Cardiac Imaging [facility] [phone] office [phone] cell   Pt states he has to use inhalers or else he has trouble breathing. Last OV HR 61 using daily meds per usual.   [place] Dec

## 2021-04-15 ENCOUNTER — Other Ambulatory Visit: Payer: Self-pay

## 2021-04-15 ENCOUNTER — Encounter (HOSPITAL_COMMUNITY): Payer: Self-pay

## 2021-04-15 ENCOUNTER — Ambulatory Visit (HOSPITAL_COMMUNITY)
Admission: RE | Admit: 2021-04-15 | Discharge: 2021-04-15 | Disposition: A | Discharge status: Home / Self Care | Payer: Medicare Other | Source: Ambulatory Visit | Attending: Cardiology | Admitting: Cardiology

## 2021-04-15 ENCOUNTER — Ambulatory Visit (HOSPITAL_COMMUNITY)
Admission: RE | Admit: 2021-04-15 | Discharge: 2021-04-15 | Disposition: A | Discharge status: Home / Self Care | Payer: Medicare Other | Source: Ambulatory Visit | Attending: Cardiovascular Disease | Admitting: Cardiovascular Disease

## 2021-04-15 ENCOUNTER — Other Ambulatory Visit: Payer: Self-pay | Admitting: Cardiovascular Disease

## 2021-04-15 DIAGNOSIS — R072 Precordial pain: Secondary | ICD-10-CM | POA: Insufficient documentation

## 2021-04-15 DIAGNOSIS — Z87891 Personal history of nicotine dependence: Secondary | ICD-10-CM | POA: Diagnosis not present

## 2021-04-15 DIAGNOSIS — R931 Abnormal findings on diagnostic imaging of heart and coronary circulation: Secondary | ICD-10-CM

## 2021-04-15 DIAGNOSIS — I3139 Other pericardial effusion (noninflammatory): Secondary | ICD-10-CM | POA: Diagnosis not present

## 2021-04-15 DIAGNOSIS — I9751 Accidental puncture and laceration of a circulatory system organ or structure during a circulatory system procedure: Secondary | ICD-10-CM | POA: Diagnosis not present

## 2021-04-15 DIAGNOSIS — Z79899 Other long term (current) drug therapy: Secondary | ICD-10-CM | POA: Diagnosis not present

## 2021-04-15 DIAGNOSIS — I739 Peripheral vascular disease, unspecified: Secondary | ICD-10-CM | POA: Diagnosis not present

## 2021-04-15 DIAGNOSIS — Z7951 Long term (current) use of inhaled steroids: Secondary | ICD-10-CM | POA: Diagnosis not present

## 2021-04-15 DIAGNOSIS — Z888 Allergy status to other drugs, medicaments and biological substances status: Secondary | ICD-10-CM | POA: Diagnosis not present

## 2021-04-15 DIAGNOSIS — I25119 Atherosclerotic heart disease of native coronary artery with unspecified angina pectoris: Secondary | ICD-10-CM | POA: Diagnosis not present

## 2021-04-15 DIAGNOSIS — E7800 Pure hypercholesterolemia, unspecified: Secondary | ICD-10-CM | POA: Diagnosis not present

## 2021-04-15 DIAGNOSIS — J449 Chronic obstructive pulmonary disease, unspecified: Secondary | ICD-10-CM | POA: Diagnosis not present

## 2021-04-15 DIAGNOSIS — I2583 Coronary atherosclerosis due to lipid rich plaque: Secondary | ICD-10-CM | POA: Diagnosis not present

## 2021-04-15 DIAGNOSIS — F419 Anxiety disorder, unspecified: Secondary | ICD-10-CM | POA: Diagnosis present

## 2021-04-15 DIAGNOSIS — Z7982 Long term (current) use of aspirin: Secondary | ICD-10-CM | POA: Diagnosis not present

## 2021-04-15 DIAGNOSIS — I2510 Atherosclerotic heart disease of native coronary artery without angina pectoris: Secondary | ICD-10-CM | POA: Diagnosis not present

## 2021-04-15 DIAGNOSIS — I25118 Atherosclerotic heart disease of native coronary artery with other forms of angina pectoris: Secondary | ICD-10-CM | POA: Diagnosis not present

## 2021-04-15 MED ORDER — IOHEXOL 350 MG/ML SOLN
95.0000 mL | Freq: Once | INTRAVENOUS | Status: AC | PRN
  Administered 2021-04-15: 95 mL via INTRAVENOUS

## 2021-04-15 MED ORDER — NITROGLYCERIN 0.4 MG SL SUBL
SUBLINGUAL_TABLET | SUBLINGUAL | Status: AC
  Filled 2021-04-15: qty 2

## 2021-04-15 MED ORDER — NITROGLYCERIN 0.4 MG SL SUBL
0.8000 mg | SUBLINGUAL_TABLET | Freq: Once | SUBLINGUAL | Status: AC
  Administered 2021-04-15: 0.8 mg via SUBLINGUAL

## 2021-04-16 ENCOUNTER — Telehealth: Payer: Self-pay | Admitting: Cardiology

## 2021-04-16 ENCOUNTER — Other Ambulatory Visit: Payer: Self-pay | Admitting: Cardiology

## 2021-04-16 ENCOUNTER — Encounter: Payer: Self-pay | Admitting: *Deleted

## 2021-04-16 ENCOUNTER — Telehealth: Payer: Self-pay

## 2021-04-16 ENCOUNTER — Other Ambulatory Visit (HOSPITAL_COMMUNITY)
Admission: RE | Admit: 2021-04-16 | Discharge: 2021-04-16 | Disposition: A | Discharge status: Home / Self Care | Payer: Medicare Other | Source: Ambulatory Visit | Attending: Cardiology | Admitting: Cardiology

## 2021-04-16 DIAGNOSIS — Z01818 Encounter for other preprocedural examination: Secondary | ICD-10-CM

## 2021-04-16 DIAGNOSIS — I2510 Atherosclerotic heart disease of native coronary artery without angina pectoris: Secondary | ICD-10-CM

## 2021-04-16 LAB — CBC
HCT: 41.3 % (ref 39.0–52.0)
Hemoglobin: 13.9 g/dL (ref 13.0–17.0)
MCH: 30.6 pg (ref 26.0–34.0)
MCHC: 33.7 g/dL (ref 30.0–36.0)
MCV: 91 fL (ref 80.0–100.0)
Platelets: 168 10*3/uL (ref 150–400)
RBC: 4.54 MIL/uL (ref 4.22–5.81)
RDW: 12.9 % (ref 11.5–15.5)
WBC: 7.3 10*3/uL (ref 4.0–10.5)
nRBC: 0 % (ref 0.0–0.2)

## 2021-04-16 NOTE — Telephone Encounter (Signed)
Heart cath details were discussed with patient by L.Pinnix, LPN. Patient will have lab work today.

## 2021-04-16 NOTE — Telephone Encounter (Signed)
Left heart cath scheduled for Friday 04/16/21 at 12 pm with Dr.Thukkani  BMET was done 04/15/10   CBC order entered to be done at [facility] out-patient lab.   Had to leave message for patient to return call.

## 2021-04-16 NOTE — Telephone Encounter (Signed)
Cath scheduled. See note from C. Les Pou, Charity fundraiser. Attempted to reach pt with no success. Left a msg for pt to call office for details.

## 2021-04-16 NOTE — Telephone Encounter (Signed)
Follow Up:      Patient said he is supposed to have a procedure> He wants to know when it will be scheduled. He says this information asap please.

## 2021-04-17 ENCOUNTER — Telehealth: Payer: Self-pay | Admitting: *Deleted

## 2021-04-17 NOTE — Telephone Encounter (Signed)
Cardiac catheterization scheduled at [facility] for: Friday April 18, 2021 12 Noon [facility] Main Entrance A [facility]) at: 10 AM   No solid food after midnight prior to cath, clear liquids until 5 AM day of procedure.  Medication instructions: Hold: Lasix-AM of procedure  Except hold medications usual morning medications can be taken pre-cath with sips of water including aspirin 81 mg.    Confirmed patient has responsible adult to drive home post procedure and be with patient first 24 hours after arriving home.  [facility] does allow one visitor to accompany you and wait in the hospital waiting room while you are there for your procedure. You and your visitor will be asked to wear a mask once you enter the hospital.   Patient reports does not currently have any symptoms concerning for COVID-19 and no household members with COVID-19 like illness.                  Reviewed procedure/mask/visitor instructions with patient.

## 2021-04-18 ENCOUNTER — Inpatient Hospital Stay (HOSPITAL_COMMUNITY)
Admission: RE | Admit: 2021-04-18 | Discharge: 2021-04-19 | DRG: 247 | Disposition: A | Discharge status: Home / Self Care | Payer: Medicare Other | Attending: Internal Medicine | Admitting: Internal Medicine

## 2021-04-18 ENCOUNTER — Encounter (HOSPITAL_COMMUNITY)
Admission: RE | Disposition: A | Discharge status: Home / Self Care | Payer: Self-pay | Source: Home / Self Care | Attending: Internal Medicine

## 2021-04-18 ENCOUNTER — Other Ambulatory Visit: Payer: Self-pay

## 2021-04-18 ENCOUNTER — Ambulatory Visit (HOSPITAL_COMMUNITY): Payer: Medicare Other

## 2021-04-18 DIAGNOSIS — Z7951 Long term (current) use of inhaled steroids: Secondary | ICD-10-CM | POA: Diagnosis not present

## 2021-04-18 DIAGNOSIS — Z79899 Other long term (current) drug therapy: Secondary | ICD-10-CM

## 2021-04-18 DIAGNOSIS — I25119 Atherosclerotic heart disease of native coronary artery with unspecified angina pectoris: Secondary | ICD-10-CM | POA: Diagnosis present

## 2021-04-18 DIAGNOSIS — I3139 Other pericardial effusion (noninflammatory): Secondary | ICD-10-CM | POA: Diagnosis present

## 2021-04-18 DIAGNOSIS — E7800 Pure hypercholesterolemia, unspecified: Secondary | ICD-10-CM | POA: Diagnosis present

## 2021-04-18 DIAGNOSIS — Z888 Allergy status to other drugs, medicaments and biological substances status: Secondary | ICD-10-CM

## 2021-04-18 DIAGNOSIS — J449 Chronic obstructive pulmonary disease, unspecified: Secondary | ICD-10-CM | POA: Diagnosis present

## 2021-04-18 DIAGNOSIS — Z955 Presence of coronary angioplasty implant and graft: Secondary | ICD-10-CM

## 2021-04-18 DIAGNOSIS — I2510 Atherosclerotic heart disease of native coronary artery without angina pectoris: Secondary | ICD-10-CM

## 2021-04-18 DIAGNOSIS — I739 Peripheral vascular disease, unspecified: Secondary | ICD-10-CM | POA: Diagnosis present

## 2021-04-18 DIAGNOSIS — Z7982 Long term (current) use of aspirin: Secondary | ICD-10-CM | POA: Diagnosis not present

## 2021-04-18 DIAGNOSIS — Z87891 Personal history of nicotine dependence: Secondary | ICD-10-CM

## 2021-04-18 DIAGNOSIS — I2583 Coronary atherosclerosis due to lipid rich plaque: Secondary | ICD-10-CM | POA: Diagnosis present

## 2021-04-18 DIAGNOSIS — I9751 Accidental puncture and laceration of a circulatory system organ or structure during a circulatory system procedure: Secondary | ICD-10-CM | POA: Diagnosis not present

## 2021-04-18 DIAGNOSIS — R079 Chest pain, unspecified: Secondary | ICD-10-CM | POA: Diagnosis present

## 2021-04-18 DIAGNOSIS — F419 Anxiety disorder, unspecified: Secondary | ICD-10-CM | POA: Diagnosis present

## 2021-04-18 DIAGNOSIS — E781 Pure hyperglyceridemia: Secondary | ICD-10-CM

## 2021-04-18 DIAGNOSIS — I25118 Atherosclerotic heart disease of native coronary artery with other forms of angina pectoris: Secondary | ICD-10-CM | POA: Diagnosis not present

## 2021-04-18 DIAGNOSIS — E785 Hyperlipidemia, unspecified: Secondary | ICD-10-CM

## 2021-04-18 HISTORY — PX: LEFT HEART CATH AND CORONARY ANGIOGRAPHY: CATH118249

## 2021-04-18 HISTORY — PX: CORONARY STENT INTERVENTION: CATH118234

## 2021-04-18 LAB — ECHOCARDIOGRAM LIMITED
Height: 72 in
Weight: 3520 oz

## 2021-04-18 LAB — POCT ACTIVATED CLOTTING TIME
Activated Clotting Time: 277 seconds
Activated Clotting Time: 347 seconds
Activated Clotting Time: 266 seconds

## 2021-04-18 LAB — MRSA NEXT GEN BY PCR, NASAL: MRSA by PCR Next Gen: NOT DETECTED

## 2021-04-18 SURGERY — LEFT HEART CATH AND CORONARY ANGIOGRAPHY
Anesthesia: LOCAL

## 2021-04-18 MED ORDER — MIDAZOLAM HCL 2 MG/2ML IJ SOLN
INTRAMUSCULAR | Status: DC | PRN
  Administered 2021-04-18 (×5): 1 mg via INTRAVENOUS

## 2021-04-18 MED ORDER — SODIUM CHLORIDE 0.9 % IV SOLN: 250.0000 mL | INTRAVENOUS | Status: DC | PRN

## 2021-04-18 MED ORDER — HYDRALAZINE HCL 20 MG/ML IJ SOLN: 10.0000 mg | INTRAMUSCULAR | Status: AC | PRN

## 2021-04-18 MED ORDER — ALBUTEROL SULFATE HFA 108 (90 BASE) MCG/ACT IN AERS: 1.0000 | INHALATION_SPRAY | Freq: Four times a day (QID) | RESPIRATORY_TRACT | Status: DC | PRN

## 2021-04-18 MED ORDER — TRAZODONE HCL 150 MG PO TABS
150.0000 mg | ORAL_TABLET | Freq: Every day | ORAL | Status: DC
  Administered 2021-04-18: 150 mg via ORAL
  Filled 2021-04-18 (×2): qty 1

## 2021-04-18 MED ORDER — VERAPAMIL HCL 2.5 MG/ML IV SOLN
INTRAVENOUS | Status: AC
  Filled 2021-04-18: qty 2

## 2021-04-18 MED ORDER — SODIUM CHLORIDE 0.9 % WEIGHT BASED INFUSION: 1.0000 mL/kg/h | INTRAVENOUS | Status: DC

## 2021-04-18 MED ORDER — METOPROLOL TARTRATE 25 MG PO TABS
25.0000 mg | ORAL_TABLET | Freq: Two times a day (BID) | ORAL | Status: DC
  Administered 2021-04-18 – 2021-04-19 (×2): 25 mg via ORAL
  Filled 2021-04-18 (×2): qty 1

## 2021-04-18 MED ORDER — ARFORMOTEROL TARTRATE 15 MCG/2ML IN NEBU
15.0000 ug | INHALATION_SOLUTION | Freq: Two times a day (BID) | RESPIRATORY_TRACT | Status: DC
  Administered 2021-04-18: 15 ug via RESPIRATORY_TRACT
  Filled 2021-04-18 (×2): qty 2

## 2021-04-18 MED ORDER — TICAGRELOR 90 MG PO TABS
90.0000 mg | ORAL_TABLET | Freq: Two times a day (BID) | ORAL | Status: DC
  Administered 2021-04-19: 90 mg via ORAL
  Filled 2021-04-18: qty 1

## 2021-04-18 MED ORDER — ACETAMINOPHEN 325 MG PO TABS
650.0000 mg | ORAL_TABLET | Freq: Every day | ORAL | Status: DC
  Administered 2021-04-18: 650 mg via ORAL
  Filled 2021-04-18: qty 2

## 2021-04-18 MED ORDER — SODIUM CHLORIDE 0.9% FLUSH: 3.0000 mL | Freq: Two times a day (BID) | INTRAVENOUS | Status: DC

## 2021-04-18 MED ORDER — SODIUM CHLORIDE 0.9% FLUSH
3.0000 mL | Freq: Two times a day (BID) | INTRAVENOUS | Status: DC
  Administered 2021-04-18: 3 mL via INTRAVENOUS

## 2021-04-18 MED ORDER — ACETAMINOPHEN 325 MG PO TABS: 650.0000 mg | ORAL_TABLET | ORAL | Status: DC | PRN

## 2021-04-18 MED ORDER — FENTANYL CITRATE (PF) 100 MCG/2ML IJ SOLN
INTRAMUSCULAR | Status: AC
  Filled 2021-04-18: qty 2

## 2021-04-18 MED ORDER — BUDESONIDE 0.25 MG/2ML IN SUSP
0.2500 mg | Freq: Two times a day (BID) | RESPIRATORY_TRACT | Status: DC
  Administered 2021-04-18: 0.25 mg via RESPIRATORY_TRACT
  Filled 2021-04-18 (×2): qty 2

## 2021-04-18 MED ORDER — LABETALOL HCL 5 MG/ML IV SOLN: 10.0000 mg | INTRAVENOUS | Status: AC | PRN

## 2021-04-18 MED ORDER — SODIUM CHLORIDE 0.9% FLUSH: 3.0000 mL | INTRAVENOUS | Status: DC | PRN

## 2021-04-18 MED ORDER — MIDAZOLAM HCL 2 MG/2ML IJ SOLN
INTRAMUSCULAR | Status: AC
  Filled 2021-04-18: qty 2

## 2021-04-18 MED ORDER — ROSUVASTATIN CALCIUM 20 MG PO TABS
20.0000 mg | ORAL_TABLET | Freq: Every day | ORAL | Status: DC
  Administered 2021-04-18: 20 mg via ORAL
  Filled 2021-04-18: qty 1

## 2021-04-18 MED ORDER — TICAGRELOR 90 MG PO TABS
ORAL_TABLET | ORAL | Status: AC
  Filled 2021-04-18: qty 2

## 2021-04-18 MED ORDER — FAMOTIDINE 20 MG PO TABS
20.0000 mg | ORAL_TABLET | Freq: Two times a day (BID) | ORAL | Status: DC
  Administered 2021-04-18 – 2021-04-19 (×2): 20 mg via ORAL
  Filled 2021-04-18 (×2): qty 1

## 2021-04-18 MED ORDER — LIDOCAINE HCL (PF) 1 % IJ SOLN
INTRAMUSCULAR | Status: AC
  Filled 2021-04-18: qty 30

## 2021-04-18 MED ORDER — UMECLIDINIUM BROMIDE 62.5 MCG/INH IN AEPB
1.0000 | INHALATION_SPRAY | Freq: Every day | RESPIRATORY_TRACT | Status: DC
  Filled 2021-04-18: qty 7

## 2021-04-18 MED ORDER — ONDANSETRON HCL 4 MG/2ML IJ SOLN: 4.0000 mg | Freq: Four times a day (QID) | INTRAMUSCULAR | Status: DC | PRN

## 2021-04-18 MED ORDER — HEPARIN (PORCINE) IN NACL 1000-0.9 UT/500ML-% IV SOLN
INTRAVENOUS | Status: DC | PRN
  Administered 2021-04-18 (×2): 500 mL

## 2021-04-18 MED ORDER — ASPIRIN 81 MG PO CHEW: 81.0000 mg | CHEWABLE_TABLET | ORAL | Status: DC

## 2021-04-18 MED ORDER — SODIUM CHLORIDE 0.9% FLUSH
3.0000 mL | INTRAVENOUS | Status: DC | PRN
  Administered 2021-04-19: 3 mL via INTRAVENOUS

## 2021-04-18 MED ORDER — TICAGRELOR 90 MG PO TABS
ORAL_TABLET | ORAL | Status: DC | PRN
  Administered 2021-04-18: 180 mg via ORAL

## 2021-04-18 MED ORDER — ACETAMINOPHEN ER 650 MG PO TBCR: 1300.0000 mg | EXTENDED_RELEASE_TABLET | Freq: Every day | ORAL | Status: DC

## 2021-04-18 MED ORDER — ALBUTEROL SULFATE (2.5 MG/3ML) 0.083% IN NEBU: 2.5000 mg | INHALATION_SOLUTION | Freq: Four times a day (QID) | RESPIRATORY_TRACT | Status: DC | PRN

## 2021-04-18 MED ORDER — FUROSEMIDE 20 MG PO TABS
20.0000 mg | ORAL_TABLET | Freq: Two times a day (BID) | ORAL | Status: DC
  Administered 2021-04-18 – 2021-04-19 (×2): 20 mg via ORAL
  Filled 2021-04-18 (×2): qty 1

## 2021-04-18 MED ORDER — BUDESON-GLYCOPYRROL-FORMOTEROL 160-9-4.8 MCG/ACT IN AERO: 2.0000 | INHALATION_SPRAY | Freq: Two times a day (BID) | RESPIRATORY_TRACT | Status: DC

## 2021-04-18 MED ORDER — IOHEXOL 350 MG/ML SOLN
INTRAVENOUS | Status: DC | PRN
  Administered 2021-04-18: 230 mL

## 2021-04-18 MED ORDER — ASPIRIN EC 81 MG PO TBEC: 81.0000 mg | DELAYED_RELEASE_TABLET | Freq: Every day | ORAL | Status: DC

## 2021-04-18 MED ORDER — ALPRAZOLAM 0.5 MG PO TABS
2.0000 mg | ORAL_TABLET | Freq: Every day | ORAL | Status: DC
  Administered 2021-04-18: 2 mg via ORAL
  Filled 2021-04-18: qty 4

## 2021-04-18 MED ORDER — ASPIRIN 81 MG PO CHEW
81.0000 mg | CHEWABLE_TABLET | Freq: Every day | ORAL | Status: DC
  Administered 2021-04-19: 81 mg via ORAL
  Filled 2021-04-18: qty 1

## 2021-04-18 MED ORDER — NITROGLYCERIN 1 MG/10 ML FOR IR/CATH LAB
INTRA_ARTERIAL | Status: AC
  Filled 2021-04-18: qty 10

## 2021-04-18 MED ORDER — HEPARIN (PORCINE) IN NACL 1000-0.9 UT/500ML-% IV SOLN
INTRAVENOUS | Status: AC
  Filled 2021-04-18: qty 1000

## 2021-04-18 MED ORDER — VERAPAMIL HCL 2.5 MG/ML IV SOLN
INTRAVENOUS | Status: DC | PRN
  Administered 2021-04-18: 10 mL via INTRA_ARTERIAL

## 2021-04-18 MED ORDER — HEPARIN SODIUM (PORCINE) 1000 UNIT/ML IJ SOLN
INTRAMUSCULAR | Status: DC | PRN
  Administered 2021-04-18 (×2): 1000 [IU] via INTRAVENOUS
  Administered 2021-04-18 (×2): 5000 [IU] via INTRAVENOUS

## 2021-04-18 MED ORDER — FUROSEMIDE 10 MG/ML IJ SOLN
INTRAMUSCULAR | Status: DC | PRN
  Administered 2021-04-18: 20 mg via INTRAVENOUS

## 2021-04-18 MED ORDER — SODIUM CHLORIDE 0.9 % WEIGHT BASED INFUSION
3.0000 mL/kg/h | INTRAVENOUS | Status: DC
  Administered 2021-04-18: 3 mL/kg/h via INTRAVENOUS

## 2021-04-18 MED ORDER — CHLORHEXIDINE GLUCONATE CLOTH 2 % EX PADS
6.0000 | MEDICATED_PAD | Freq: Every day | CUTANEOUS | Status: DC
  Administered 2021-04-19: 6 via TOPICAL

## 2021-04-18 MED ORDER — LIDOCAINE HCL (PF) 1 % IJ SOLN
INTRAMUSCULAR | Status: DC | PRN
  Administered 2021-04-18: 2 mL

## 2021-04-18 MED ORDER — FENTANYL CITRATE (PF) 100 MCG/2ML IJ SOLN
INTRAMUSCULAR | Status: DC | PRN
  Administered 2021-04-18: 50 ug via INTRAVENOUS
  Administered 2021-04-18 (×4): 25 ug via INTRAVENOUS

## 2021-04-18 MED ORDER — HEPARIN SODIUM (PORCINE) 1000 UNIT/ML IJ SOLN
INTRAMUSCULAR | Status: AC
  Filled 2021-04-18: qty 1

## 2021-04-18 MED ORDER — FUROSEMIDE 10 MG/ML IJ SOLN
INTRAMUSCULAR | Status: AC
  Filled 2021-04-18: qty 4

## 2021-04-18 SURGICAL SUPPLY — 27 items
BALLN SAPPHIRE 2.5X12 (BALLOONS) ×2
BALLN SAPPHIRE ~~LOC~~ 3.0X15 (BALLOONS) ×1 IMPLANT
BALLN SAPPHIRE ~~LOC~~ 3.5X15 (BALLOONS) ×1 IMPLANT
BALLN SAPPHIRE ~~LOC~~ 4.0X12 (BALLOONS) ×1 IMPLANT
BALLOON SAPPHIRE 2.5X12 (BALLOONS) IMPLANT
CATH DIAG 6FR JR4 (CATHETERS) ×1 IMPLANT
CATH DIAG 6FR PIGTAIL ANGLED (CATHETERS) ×1 IMPLANT
CATH INFINITI 6F FL3.5 (CATHETERS) ×1 IMPLANT
CATH LAUNCHER 6FR EBU3.5 (CATHETERS) ×1 IMPLANT
CATH SHOCKWAVE 3.0X12 (CATHETERS) IMPLANT
CATH SHOCKWAVE 4.0X12 (CATHETERS) IMPLANT
CATHETER SHOCKWAVE 3.0X12 (CATHETERS) ×2
CATHETER SHOCKWAVE 4.0X12 (CATHETERS) ×2
DEVICE RAD COMP TR BAND LRG (VASCULAR PRODUCTS) ×1 IMPLANT
ELECT DEFIB PAD ADLT CADENCE (PAD) ×1 IMPLANT
GLIDESHEATH SLEND SS 6F .021 (SHEATH) ×1 IMPLANT
GUIDEWIRE PRESSURE X 175 (WIRE) ×1 IMPLANT
GUIDEWIRE VAS SION BLUE 190 (WIRE) ×1 IMPLANT
KIT ENCORE 26 ADVANTAGE (KITS) ×1 IMPLANT
KIT ESSENTIALS PG (KITS) ×1 IMPLANT
PACK CARDIAC CATHETERIZATION (CUSTOM PROCEDURE TRAY) ×1 IMPLANT
STENT ONYX FRONTIER 3.0X26 (Permanent Stent) ×1 IMPLANT
STENT ONYX FRONTIER 4.0X18 (Permanent Stent) ×1 IMPLANT
STENT PK PAPYRUS 4.0X20 (Permanent Stent) ×1 IMPLANT
TRANSDUCER W/STOPCOCK (MISCELLANEOUS) ×1 IMPLANT
TUBING CIL FLEX 10 FLL-RA (TUBING) ×1 IMPLANT
WIRE EMERALD 3MM-J .035X260CM (WIRE) ×1 IMPLANT

## 2021-04-18 NOTE — Progress Notes (Signed)
Echocardiogram 2D Echocardiogram has been performed.  Warren Lacy Jilliann Subramanian RDCS 04/18/2021, 3:01 PM

## 2021-04-18 NOTE — Interval H&P Note (Signed)
History and Physical Interval Note:  04/18/2021 11:58 AM  Bobby Gonzales  has presented today for surgery, with the diagnosis of abnormal cardiac CT.  The various methods of treatment have been discussed with the patient and family. After consideration of risks, benefits and other options for treatment, the patient has consented to  Procedure(s): LEFT HEART CATH AND CORONARY ANGIOGRAPHY (N/A) as a surgical intervention.  The patient's history has been reviewed, patient examined, no change in status, stable for surgery.  I have reviewed the patient's chart and labs.  Questions were answered to the patient's satisfaction.    Cath Lab Visit (complete for each Cath Lab visit)  Clinical Evaluation Leading to the Procedure:   ACS: No.  Non-ACS:    Anginal Classification: CCS II  Anti-ischemic medical therapy: Minimal Therapy (1 class of medications)  Non-Invasive Test Results: High-risk stress test findings: cardiac mortality >3%/year  Prior CABG: No previous CABG        Orbie Pyo

## 2021-04-18 NOTE — CV Procedure (Signed)
Patient referred for coronary angiography after CT demonstrated high grade LAD disease.  Highly calcified LAD lesion seen treated with Shockwave and DES x 1.  Proximal dissection seen which was covered by another overlapping stent.  Procedure complicated by small perforation successfully treated with PK Papyrus.  Limited echo x 2 in lab demonstrated small effusion not enlarging.  Patient hemodynamically stable.  Will monitor in 2H overnight with limited echo in AM prior to discharge.

## 2021-04-19 ENCOUNTER — Inpatient Hospital Stay (HOSPITAL_COMMUNITY): Payer: Medicare Other

## 2021-04-19 DIAGNOSIS — E781 Pure hyperglyceridemia: Secondary | ICD-10-CM

## 2021-04-19 DIAGNOSIS — I25118 Atherosclerotic heart disease of native coronary artery with other forms of angina pectoris: Secondary | ICD-10-CM

## 2021-04-19 DIAGNOSIS — I3139 Other pericardial effusion (noninflammatory): Secondary | ICD-10-CM

## 2021-04-19 DIAGNOSIS — E785 Hyperlipidemia, unspecified: Secondary | ICD-10-CM

## 2021-04-19 LAB — CBC
HCT: 42.5 % (ref 39.0–52.0)
Hemoglobin: 14.8 g/dL (ref 13.0–17.0)
MCH: 30.3 pg (ref 26.0–34.0)
MCHC: 34.8 g/dL (ref 30.0–36.0)
MCV: 87.1 fL (ref 80.0–100.0)
Platelets: 191 10*3/uL (ref 150–400)
RBC: 4.88 MIL/uL (ref 4.22–5.81)
RDW: 12.8 % (ref 11.5–15.5)
WBC: 10 10*3/uL (ref 4.0–10.5)
nRBC: 0 % (ref 0.0–0.2)

## 2021-04-19 LAB — LIPID PANEL
Cholesterol: 178 mg/dL (ref 0–200)
HDL: 36 mg/dL — ABNORMAL LOW (ref >40)
LDL Cholesterol: 77 mg/dL (ref 0–99)
Total CHOL/HDL Ratio: 4.9 RATIO
Triglycerides: 325 mg/dL — ABNORMAL HIGH (ref <150)
VLDL: 65 mg/dL — ABNORMAL HIGH (ref 0–40)

## 2021-04-19 LAB — BASIC METABOLIC PANEL
Anion gap: 12 (ref 5–15)
BUN: 16 mg/dL (ref 6–20)
CO2: 26 mmol/L (ref 22–32)
Calcium: 9.6 mg/dL (ref 8.9–10.3)
Chloride: 97 mmol/L — ABNORMAL LOW (ref 98–111)
Creatinine, Ser: 1.39 mg/dL — ABNORMAL HIGH (ref 0.61–1.24)
GFR, Estimated: 59 mL/min — ABNORMAL LOW (ref >60)
Glucose, Bld: 112 mg/dL — ABNORMAL HIGH (ref 70–99)
Potassium: 3.9 mmol/L (ref 3.5–5.1)
Sodium: 135 mmol/L (ref 135–145)

## 2021-04-19 LAB — ECHOCARDIOGRAM LIMITED
Height: 72 in
Weight: 3224.01 oz

## 2021-04-19 MED ORDER — ISOSORBIDE MONONITRATE ER 30 MG PO TB24: 30.0000 mg | ORAL_TABLET | Freq: Every day | ORAL | 3 refills | Status: DC

## 2021-04-19 MED ORDER — ROSUVASTATIN CALCIUM 40 MG PO TABS: 40.0000 mg | ORAL_TABLET | Freq: Every day | ORAL | 3 refills | Status: DC

## 2021-04-19 MED ORDER — TICAGRELOR 90 MG PO TABS: 90.0000 mg | ORAL_TABLET | Freq: Two times a day (BID) | ORAL | 3 refills | Status: DC

## 2021-04-19 MED ORDER — ISOSORBIDE MONONITRATE ER 30 MG PO TB24
30.0000 mg | ORAL_TABLET | Freq: Every day | ORAL | Status: DC
  Administered 2021-04-19: 30 mg via ORAL
  Filled 2021-04-19: qty 1

## 2021-04-19 NOTE — Discharge Instructions (Signed)

## 2021-04-19 NOTE — Progress Notes (Signed)
CARDIAC REHAB PHASE I   PRE:  Rate/Rhythm: 81 SR  BP:  Sitting: 128/83      SaO2: 95 RA  MODE:  Ambulation: 885 ft   POST:  Rate/Rhythm: 102 ST  BP:  Sitting: 132/104    SaO2: 97 RA   Pt ambulated 896ft in hallway independently with steady gait. Pt denies CP, SOB, or dizziness. Pt educated on importance of ASA and Brilinta. Given stent card and heart healthy diet. Reviewed site care, restrictions, and exercise guidelines. Will refer to CRP II [place].  [id number] Reynold Bowen, RN BSN 04/19/2021 10:17 AM

## 2021-04-19 NOTE — Progress Notes (Signed)
Discharge instructions reviewed with patient and significant other. All questions answered. PIV x 2 removed. Pt taken out via wheelchair to main entrance by [employer], NT, discharged to significant other.

## 2021-04-19 NOTE — Discharge Summary (Signed)
coronary CTA interpretation by the cardiologist is attached. COMPARISON:  03/13/2020 chest radiograph. FINDINGS: Vascular: Aortic atherosclerosis. No central pulmonary embolism, on this non-dedicated study. Mediastinum/Nodes: No imaged thoracic adenopathy. Lungs/Pleura: No pleural fluid. Moderate basilar predominant bronchial wall thickening. Mild centrilobular emphysema. Subtle micronodularity, most likely secondary to smoking related respiratory bronchiolitis. Upper Abdomen: Hepatic steatosis. Normal imaged portions of the spleen, stomach. Musculoskeletal: No acute osseous abnormality. IMPRESSION: 1.  No acute findings in the imaged extracardiac chest. 2. Aortic atherosclerosis (ICD10-I70.0) and emphysema (ICD10-J43.9). 3. Hepatic steatosis Electronically Signed: By: Jeronimo Greaves M.D. On: 04/15/2021 11:38   ECHOCARDIOGRAM LIMITED  Result Date: 04/19/2021    ECHOCARDIOGRAM LIMITED REPORT   Patient Name:   Bobby Gonzales Date of Exam: 04/19/2021 Medical Rec #:  [id number]      Height:       72.0 in Accession #:    [id number]     Weight:        201.5 lb Date of Birth:  [birth year]      BSA:          2.137 m Patient Age:    55 years       BP:           121/80 mmHg Patient Gender: M              HR:           67 bpm. Exam Location:  Inpatient Procedure: Limited Echo Indications:    Pericardial effusion  History:        Patient has prior history of Echocardiogram examinations, most                 recent 04/18/2021. CAD; COPD.  Sonographer:    Ross Ludwig RDCS (AE) Referring Phys: [id number] Orbie Pyo IMPRESSIONS  1. Trivial pericardial effusion.  2. Left ventricular ejection fraction, by estimation, is 55 to 60%. The left ventricle has normal function. The left ventricle demonstrates regional wall motion abnormalities (see scoring diagram/findings for description).  3. Right ventricular systolic function is normal. The right ventricular size is normal.  4. The inferior vena cava is normal in size with greater than 50% respiratory variability, suggesting right atrial pressure of 3 mmHg. FINDINGS  Left Ventricle: Left ventricular ejection fraction, by estimation, is 55 to 60%. The left ventricle has normal function. The left ventricle demonstrates regional wall motion abnormalities.  LV Wall Scoring: The mid inferoseptal segment and apical septal segment are hypokinetic. Right Ventricle: The right ventricular size is normal. No increase in right ventricular wall thickness. Right ventricular systolic function is normal. Pericardium: Trivial pericardial effusion is present. Presence of pericardial fat pad. Venous: The inferior vena cava is normal in size with greater than 50% respiratory variability, suggesting right atrial pressure of 3 mmHg. IVC IVC diam: 2.00 cm Weston Brass MD Electronically signed by Weston Brass MD Signature Date/Time: 04/19/2021/9:50:29 AM    Final    ECHOCARDIOGRAM LIMITED  Result Date: 04/18/2021    ECHOCARDIOGRAM LIMITED REPORT   Patient Name:   Bobby Gonzales Date of Exam: 04/18/2021 Medical Rec #:  [id number]      Height:        72.0 in Accession #:    [id number]     Weight:       220.0 lb Date of Birth:  1966-07-05      BSA:          2.219 m Patient Age:    82 years  Discharge Summary    Patient ID: Bobby Gonzales MRN: [id number]; DOB: 10/13/1965  Admit date: 04/18/2021 Discharge date: 04/19/2021  PCP:  Benita Stabile, MD   [facility] Providers Cardiologist:  Armanda Magic, MD   Discharge Diagnoses    Principal Problem:   Coronary artery disease due to lipid rich plaque Active Problems:   COPD GOLD II   Exertional chest pain   CAD (coronary artery disease)   Hyperlipidemia with target LDL less than 70    Diagnostic Studies/Procedures    Echo limited 04/19/21:  1. Trivial pericardial effusion.   2. Left ventricular ejection fraction, by estimation, is 55 to 60%. The  left ventricle has normal function. The left ventricle demonstrates  regional wall motion abnormalities (see scoring diagram/findings for  description).   3. Right ventricular systolic function is normal. The right ventricular  size is normal.   4. The inferior vena cava is normal in size with greater than 50%  respiratory variability, suggesting right atrial pressure of 3 mmHg.  _____________  Echo 04/18/21: 1. A small pericardial effusion is present.   2. Left ventricular ejection fraction, by estimation, is 50 to 55%. The  left ventricle has low normal function. The left ventricle demonstrates  regional wall motion abnormalities (see scoring diagram/findings for  description).   3. Right ventricular systolic function is normal. The right ventricular  size is normal. Mildly increased right ventricular wall thickness.   4. The inferior vena cava is dilated in size with >50% respiratory  variability, suggesting right atrial pressure of 8 mmHg.  _____________  Left heart cath 04/18/21:    Prox RCA lesion is 100% stenosed.   Mid LAD lesion is 80% stenosed.   Non-stenotic Prox LAD lesion.   A drug-eluting stent was successfully placed using a STENT ONYX FRONTIER 3.0X26.   A stent was successfully placed.   A drug-eluting stent was successfully placed using a  STENT ONYX FRONTIER 4.0X18.   Post intervention, there is a 0% residual stenosis.   Post intervention, there is a 0% residual stenosis.   LV end diastolic pressure is severely elevated.   1.  Chronic total occlusion of the right coronary artery with right to right bridging collaterals 2.  Highly calcified mid LAD lesion treated with 2 overlapping drug-eluting stents in conjunction with shockwave lithotripsy.  The procedure was complicated by a perforation that was successfully treated with the PK papyrus covered stent. 3.  Highly elevated LVEDP.     Recommendations: We will monitor overnight with limited echocardiogram in the morning.  Patient should continue DAPT for at least 6 months.  Diagnostic Dominance: Right Intervention      History of Present Illness     [name] is a 54 y.o. male with COPD, hyperlipidemia, and claudication who was evaluated in the outpatient setting due to angina. He was referred for CT scan which demonstrated high-grade calcified LAD disease and occlusion of the right coronary artery. He was referred for coronary angiography and possible PCI for further evaluation. Underwent PCI to the proximal LAD with 2 overlapping DES assisted by shockwave lithotripsy. Small perforation treated with PK papyrus covered stent.   Hospital Course     Consultants: none  Chest pain CAD Heart cath showed CTO of proximal RCA with right to right bridging collaterals.  Highly calcified mid LAD lesion was treated with two overlapping DES assisted by shockwave lithotripsy and complicated by small perforation treated with PK papyrus covered stent. He tolerated the procedure well and  coronary CTA interpretation by the cardiologist is attached. COMPARISON:  03/13/2020 chest radiograph. FINDINGS: Vascular: Aortic atherosclerosis. No central pulmonary embolism, on this non-dedicated study. Mediastinum/Nodes: No imaged thoracic adenopathy. Lungs/Pleura: No pleural fluid. Moderate basilar predominant bronchial wall thickening. Mild centrilobular emphysema. Subtle micronodularity, most likely secondary to smoking related respiratory bronchiolitis. Upper Abdomen: Hepatic steatosis. Normal imaged portions of the spleen, stomach. Musculoskeletal: No acute osseous abnormality. IMPRESSION: 1.  No acute findings in the imaged extracardiac chest. 2. Aortic atherosclerosis (ICD10-I70.0) and emphysema (ICD10-J43.9). 3. Hepatic steatosis Electronically Signed: By: Jeronimo Greaves M.D. On: 04/15/2021 11:38   ECHOCARDIOGRAM LIMITED  Result Date: 04/19/2021    ECHOCARDIOGRAM LIMITED REPORT   Patient Name:   Bobby Gonzales Date of Exam: 04/19/2021 Medical Rec #:  [id number]      Height:       72.0 in Accession #:    [id number]     Weight:        201.5 lb Date of Birth:  [birth year]      BSA:          2.137 m Patient Age:    55 years       BP:           121/80 mmHg Patient Gender: M              HR:           67 bpm. Exam Location:  Inpatient Procedure: Limited Echo Indications:    Pericardial effusion  History:        Patient has prior history of Echocardiogram examinations, most                 recent 04/18/2021. CAD; COPD.  Sonographer:    Ross Ludwig RDCS (AE) Referring Phys: [id number] Orbie Pyo IMPRESSIONS  1. Trivial pericardial effusion.  2. Left ventricular ejection fraction, by estimation, is 55 to 60%. The left ventricle has normal function. The left ventricle demonstrates regional wall motion abnormalities (see scoring diagram/findings for description).  3. Right ventricular systolic function is normal. The right ventricular size is normal.  4. The inferior vena cava is normal in size with greater than 50% respiratory variability, suggesting right atrial pressure of 3 mmHg. FINDINGS  Left Ventricle: Left ventricular ejection fraction, by estimation, is 55 to 60%. The left ventricle has normal function. The left ventricle demonstrates regional wall motion abnormalities.  LV Wall Scoring: The mid inferoseptal segment and apical septal segment are hypokinetic. Right Ventricle: The right ventricular size is normal. No increase in right ventricular wall thickness. Right ventricular systolic function is normal. Pericardium: Trivial pericardial effusion is present. Presence of pericardial fat pad. Venous: The inferior vena cava is normal in size with greater than 50% respiratory variability, suggesting right atrial pressure of 3 mmHg. IVC IVC diam: 2.00 cm Weston Brass MD Electronically signed by Weston Brass MD Signature Date/Time: 04/19/2021/9:50:29 AM    Final    ECHOCARDIOGRAM LIMITED  Result Date: 04/18/2021    ECHOCARDIOGRAM LIMITED REPORT   Patient Name:   Bobby Gonzales Date of Exam: 04/18/2021 Medical Rec #:  [id number]      Height:        72.0 in Accession #:    [id number]     Weight:       220.0 lb Date of Birth:  1966-07-05      BSA:          2.219 m Patient Age:    82 years  Discharge Summary    Patient ID: Bobby Gonzales MRN: [id number]; DOB: 10/13/1965  Admit date: 04/18/2021 Discharge date: 04/19/2021  PCP:  Benita Stabile, MD   [facility] Providers Cardiologist:  Armanda Magic, MD   Discharge Diagnoses    Principal Problem:   Coronary artery disease due to lipid rich plaque Active Problems:   COPD GOLD II   Exertional chest pain   CAD (coronary artery disease)   Hyperlipidemia with target LDL less than 70    Diagnostic Studies/Procedures    Echo limited 04/19/21:  1. Trivial pericardial effusion.   2. Left ventricular ejection fraction, by estimation, is 55 to 60%. The  left ventricle has normal function. The left ventricle demonstrates  regional wall motion abnormalities (see scoring diagram/findings for  description).   3. Right ventricular systolic function is normal. The right ventricular  size is normal.   4. The inferior vena cava is normal in size with greater than 50%  respiratory variability, suggesting right atrial pressure of 3 mmHg.  _____________  Echo 04/18/21: 1. A small pericardial effusion is present.   2. Left ventricular ejection fraction, by estimation, is 50 to 55%. The  left ventricle has low normal function. The left ventricle demonstrates  regional wall motion abnormalities (see scoring diagram/findings for  description).   3. Right ventricular systolic function is normal. The right ventricular  size is normal. Mildly increased right ventricular wall thickness.   4. The inferior vena cava is dilated in size with >50% respiratory  variability, suggesting right atrial pressure of 8 mmHg.  _____________  Left heart cath 04/18/21:    Prox RCA lesion is 100% stenosed.   Mid LAD lesion is 80% stenosed.   Non-stenotic Prox LAD lesion.   A drug-eluting stent was successfully placed using a STENT ONYX FRONTIER 3.0X26.   A stent was successfully placed.   A drug-eluting stent was successfully placed using a  STENT ONYX FRONTIER 4.0X18.   Post intervention, there is a 0% residual stenosis.   Post intervention, there is a 0% residual stenosis.   LV end diastolic pressure is severely elevated.   1.  Chronic total occlusion of the right coronary artery with right to right bridging collaterals 2.  Highly calcified mid LAD lesion treated with 2 overlapping drug-eluting stents in conjunction with shockwave lithotripsy.  The procedure was complicated by a perforation that was successfully treated with the PK papyrus covered stent. 3.  Highly elevated LVEDP.     Recommendations: We will monitor overnight with limited echocardiogram in the morning.  Patient should continue DAPT for at least 6 months.  Diagnostic Dominance: Right Intervention      History of Present Illness     [name] is a 54 y.o. male with COPD, hyperlipidemia, and claudication who was evaluated in the outpatient setting due to angina. He was referred for CT scan which demonstrated high-grade calcified LAD disease and occlusion of the right coronary artery. He was referred for coronary angiography and possible PCI for further evaluation. Underwent PCI to the proximal LAD with 2 overlapping DES assisted by shockwave lithotripsy. Small perforation treated with PK papyrus covered stent.   Hospital Course     Consultants: none  Chest pain CAD Heart cath showed CTO of proximal RCA with right to right bridging collaterals.  Highly calcified mid LAD lesion was treated with two overlapping DES assisted by shockwave lithotripsy and complicated by small perforation treated with PK papyrus covered stent. He tolerated the procedure well and  coronary CTA interpretation by the cardiologist is attached. COMPARISON:  03/13/2020 chest radiograph. FINDINGS: Vascular: Aortic atherosclerosis. No central pulmonary embolism, on this non-dedicated study. Mediastinum/Nodes: No imaged thoracic adenopathy. Lungs/Pleura: No pleural fluid. Moderate basilar predominant bronchial wall thickening. Mild centrilobular emphysema. Subtle micronodularity, most likely secondary to smoking related respiratory bronchiolitis. Upper Abdomen: Hepatic steatosis. Normal imaged portions of the spleen, stomach. Musculoskeletal: No acute osseous abnormality. IMPRESSION: 1.  No acute findings in the imaged extracardiac chest. 2. Aortic atherosclerosis (ICD10-I70.0) and emphysema (ICD10-J43.9). 3. Hepatic steatosis Electronically Signed: By: Jeronimo Greaves M.D. On: 04/15/2021 11:38   ECHOCARDIOGRAM LIMITED  Result Date: 04/19/2021    ECHOCARDIOGRAM LIMITED REPORT   Patient Name:   Bobby Gonzales Date of Exam: 04/19/2021 Medical Rec #:  [id number]      Height:       72.0 in Accession #:    [id number]     Weight:        201.5 lb Date of Birth:  [birth year]      BSA:          2.137 m Patient Age:    55 years       BP:           121/80 mmHg Patient Gender: M              HR:           67 bpm. Exam Location:  Inpatient Procedure: Limited Echo Indications:    Pericardial effusion  History:        Patient has prior history of Echocardiogram examinations, most                 recent 04/18/2021. CAD; COPD.  Sonographer:    Ross Ludwig RDCS (AE) Referring Phys: [id number] Orbie Pyo IMPRESSIONS  1. Trivial pericardial effusion.  2. Left ventricular ejection fraction, by estimation, is 55 to 60%. The left ventricle has normal function. The left ventricle demonstrates regional wall motion abnormalities (see scoring diagram/findings for description).  3. Right ventricular systolic function is normal. The right ventricular size is normal.  4. The inferior vena cava is normal in size with greater than 50% respiratory variability, suggesting right atrial pressure of 3 mmHg. FINDINGS  Left Ventricle: Left ventricular ejection fraction, by estimation, is 55 to 60%. The left ventricle has normal function. The left ventricle demonstrates regional wall motion abnormalities.  LV Wall Scoring: The mid inferoseptal segment and apical septal segment are hypokinetic. Right Ventricle: The right ventricular size is normal. No increase in right ventricular wall thickness. Right ventricular systolic function is normal. Pericardium: Trivial pericardial effusion is present. Presence of pericardial fat pad. Venous: The inferior vena cava is normal in size with greater than 50% respiratory variability, suggesting right atrial pressure of 3 mmHg. IVC IVC diam: 2.00 cm Weston Brass MD Electronically signed by Weston Brass MD Signature Date/Time: 04/19/2021/9:50:29 AM    Final    ECHOCARDIOGRAM LIMITED  Result Date: 04/18/2021    ECHOCARDIOGRAM LIMITED REPORT   Patient Name:   Bobby Gonzales Date of Exam: 04/18/2021 Medical Rec #:  [id number]      Height:        72.0 in Accession #:    [id number]     Weight:       220.0 lb Date of Birth:  1966-07-05      BSA:          2.219 m Patient Age:    82 years  Discharge Summary    Patient ID: Bobby Gonzales MRN: [id number]; DOB: 10/13/1965  Admit date: 04/18/2021 Discharge date: 04/19/2021  PCP:  Benita Stabile, MD   [facility] Providers Cardiologist:  Armanda Magic, MD   Discharge Diagnoses    Principal Problem:   Coronary artery disease due to lipid rich plaque Active Problems:   COPD GOLD II   Exertional chest pain   CAD (coronary artery disease)   Hyperlipidemia with target LDL less than 70    Diagnostic Studies/Procedures    Echo limited 04/19/21:  1. Trivial pericardial effusion.   2. Left ventricular ejection fraction, by estimation, is 55 to 60%. The  left ventricle has normal function. The left ventricle demonstrates  regional wall motion abnormalities (see scoring diagram/findings for  description).   3. Right ventricular systolic function is normal. The right ventricular  size is normal.   4. The inferior vena cava is normal in size with greater than 50%  respiratory variability, suggesting right atrial pressure of 3 mmHg.  _____________  Echo 04/18/21: 1. A small pericardial effusion is present.   2. Left ventricular ejection fraction, by estimation, is 50 to 55%. The  left ventricle has low normal function. The left ventricle demonstrates  regional wall motion abnormalities (see scoring diagram/findings for  description).   3. Right ventricular systolic function is normal. The right ventricular  size is normal. Mildly increased right ventricular wall thickness.   4. The inferior vena cava is dilated in size with >50% respiratory  variability, suggesting right atrial pressure of 8 mmHg.  _____________  Left heart cath 04/18/21:    Prox RCA lesion is 100% stenosed.   Mid LAD lesion is 80% stenosed.   Non-stenotic Prox LAD lesion.   A drug-eluting stent was successfully placed using a STENT ONYX FRONTIER 3.0X26.   A stent was successfully placed.   A drug-eluting stent was successfully placed using a  STENT ONYX FRONTIER 4.0X18.   Post intervention, there is a 0% residual stenosis.   Post intervention, there is a 0% residual stenosis.   LV end diastolic pressure is severely elevated.   1.  Chronic total occlusion of the right coronary artery with right to right bridging collaterals 2.  Highly calcified mid LAD lesion treated with 2 overlapping drug-eluting stents in conjunction with shockwave lithotripsy.  The procedure was complicated by a perforation that was successfully treated with the PK papyrus covered stent. 3.  Highly elevated LVEDP.     Recommendations: We will monitor overnight with limited echocardiogram in the morning.  Patient should continue DAPT for at least 6 months.  Diagnostic Dominance: Right Intervention      History of Present Illness     [name] is a 54 y.o. male with COPD, hyperlipidemia, and claudication who was evaluated in the outpatient setting due to angina. He was referred for CT scan which demonstrated high-grade calcified LAD disease and occlusion of the right coronary artery. He was referred for coronary angiography and possible PCI for further evaluation. Underwent PCI to the proximal LAD with 2 overlapping DES assisted by shockwave lithotripsy. Small perforation treated with PK papyrus covered stent.   Hospital Course     Consultants: none  Chest pain CAD Heart cath showed CTO of proximal RCA with right to right bridging collaterals.  Highly calcified mid LAD lesion was treated with two overlapping DES assisted by shockwave lithotripsy and complicated by small perforation treated with PK papyrus covered stent. He tolerated the procedure well and

## 2021-04-19 NOTE — Progress Notes (Signed)
Progress Note  Patient Name: Bobby Gonzales Date of Encounter: 04/19/2021  Primary Cardiologist: None   Subjective   Mild nagging chest discomfort but less than prior to intervention.  Inpatient Medications    Scheduled Meds:  acetaminophen  650 mg Oral QHS   ALPRAZolam  2 mg Oral QHS   budesonide (PULMICORT) nebulizer solution  0.25 mg Nebulization BID   And   arformoterol  15 mcg Nebulization BID   aspirin  81 mg Oral Daily   Chlorhexidine Gluconate Cloth  6 each Topical Daily   famotidine  20 mg Oral BID PC   furosemide  20 mg Oral BID   metoprolol tartrate  25 mg Oral BID   rosuvastatin  20 mg Oral QHS   sodium chloride flush  3 mL Intravenous Q12H   ticagrelor  90 mg Oral BID   traZODone  150 mg Oral QHS   umeclidinium bromide  1 puff Inhalation Daily   Continuous Infusions:  sodium chloride     PRN Meds: sodium chloride, acetaminophen, albuterol, ondansetron (ZOFRAN) IV, sodium chloride flush   Vital Signs    Vitals:   04/19/21 0500 04/19/21 0600 04/19/21 0700 04/19/21 0800  BP: 117/75 137/72 (!) 146/93 121/80  Pulse: 64 63 71 79  Resp: 19 20 16  (!) 24  Temp:   98.3 F (36.8 C)   TempSrc:   Oral   SpO2: 94% 96% 95% 94%  Weight:  91.4 kg    Height:        Intake/Output Summary (Last 24 hours) at 04/19/2021 0837 Last data filed at 04/18/2021 2200 Gross per 24 hour  Intake 150 ml  Output 1950 ml  Net -1800 ml   Filed Weights   04/18/21 1001 04/19/21 0600  Weight: 99.8 kg 91.4 kg    Telemetry    SR with occasional PVCs in pairs - Personally Reviewed  ECG    NSR - Personally Reviewed  Physical Exam   GEN: No acute distress.   Neck: No JVD Cardiac: regular rhythm, normal rate, no murmurs, rubs, or gallops. Right radial pulse is 3/4, normal R radial incision site. No hematoma. Respiratory: Clear to auscultation bilaterally. GI: Soft, nontender, non-distended  MS: No edema; No deformity. Neuro:  Nonfocal  Psych: Normal affect   Labs     Chemistry Recent Labs  Lab 04/19/21 0244  NA 135  K 3.9  CL 97*  CO2 26  GLUCOSE 112*  BUN 16  CREATININE 1.39*  CALCIUM 9.6  GFRNONAA 59*  ANIONGAP 12     Hematology Recent Labs  Lab 04/16/21 1546 04/19/21 0244  WBC 7.3 10.0  RBC 4.54 4.88  HGB 13.9 14.8  HCT 41.3 42.5  MCV 91.0 87.1  MCH 30.6 30.3  MCHC 33.7 34.8  RDW 12.9 12.8  PLT 168 191    Cardiac EnzymesNo results for input(s): TROPONINI in the last 168 hours. No results for input(s): TROPIPOC in the last 168 hours.   BNPNo results for input(s): BNP, PROBNP in the last 168 hours.   DDimer No results for input(s): DDIMER in the last 168 hours.   Radiology    CARDIAC CATHETERIZATION  Result Date: 04/18/2021   Prox RCA lesion is 100% stenosed.   Mid LAD lesion is 80% stenosed.   Non-stenotic Prox LAD lesion.   A drug-eluting stent was successfully placed using a STENT ONYX FRONTIER 3.0X26.   A stent was successfully placed.   A drug-eluting stent was successfully placed using a STENT ONYX  FRONTIER 4.0X18.   Post intervention, there is a 0% residual stenosis.   Post intervention, there is a 0% residual stenosis.   LV end diastolic pressure is severely elevated. 1.  Chronic total occlusion of the right coronary artery with right to right bridging collaterals 2.  Highly calcified mid LAD lesion treated with 2 overlapping drug-eluting stents in conjunction with shockwave lithotripsy.  The procedure was complicated by a perforation that was successfully treated with the PK papyrus covered stent. 3.  Highly elevated LVEDP. Recommendations: We will monitor overnight with limited echocardiogram in the morning.  Patient should continue DAPT for at least 6 months.   ECHOCARDIOGRAM LIMITED  Result Date: 04/18/2021    ECHOCARDIOGRAM LIMITED REPORT   Patient Name:   [name] Date of Exam: 04/18/2021 Medical Rec #:  [id number]      Height:       72.0 in Accession #:    [id number]     Weight:       220.0 lb Date of  Birth:  1965-12-19      BSA:          2.219 m Patient Age:    55 years       BP:           131/75 mmHg Patient Gender: M              HR:           62 bpm. Exam Location:  Inpatient Procedure: Limited Echo Indications:    CAD Native Vessel i25.10  History:        Patient has no prior history of Echocardiogram examinations.                 COPD.  Sonographer:    Irving Burton Senior RDCS Referring Phys: Alverda Skeans IMPRESSIONS  1. A small pericardial effusion is present.  2. Left ventricular ejection fraction, by estimation, is 50 to 55%. The left ventricle has low normal function. The left ventricle demonstrates regional wall motion abnormalities (see scoring diagram/findings for description).  3. Right ventricular systolic function is normal. The right ventricular size is normal. Mildly increased right ventricular wall thickness.  4. The inferior vena cava is dilated in size with >50% respiratory variability, suggesting right atrial pressure of 8 mmHg. FINDINGS  Left Ventricle: Left ventricular ejection fraction, by estimation, is 50 to 55%. The left ventricle has low normal function. The left ventricle demonstrates regional wall motion abnormalities.  LV Wall Scoring: The mid inferoseptal segment and basal inferoseptal segment are hypokinetic. Right Ventricle: The right ventricular size is normal. Mildly increased right ventricular wall thickness. Right ventricular systolic function is normal. Pericardium: A small pericardial effusion is present. Venous: The inferior vena cava is dilated in size with greater than 50% respiratory variability, suggesting right atrial pressure of 8 mmHg. Weston Brass MD Electronically signed by Weston Brass MD Signature Date/Time: 04/18/2021/6:47:32 PM    Final     Cardiac Studies   As above.  Patient Profile     55 y.o. male with COPD, hyperlipidemia, and claudication who was evaluated in the outpatient setting due to angina. He was referred for CT scan which demonstrated  high-grade calcified LAD disease and occlusion of the right coronary artery. He was referred for coronary angiography and possible PCI for further evaluation. Underwent PCI to the proximal LAD with 2 overlapping DES assisted by shockwave lithotripsy. Small perforation treated with PK papyrus covered stent.   Assessment & Plan   Active  Problems:   Coronary artery disease due to lipid rich plaque   CAD (coronary artery disease)  -continue ASA 81 mg daily -continue ticagrelor 90 mg BID - per IC note, DAPT for at least 6 mo. I have instructed the patient that dual antiplatelet therapy should be taken for 1 year without interruption.  We have discussed the consequences of interrupted dual antiplatelet therapy and the risk for in-stent thrombosis.  - continue metoprolol 25 mg po BID - continue lasix 20 mg BID - continue rosuvastatin 20 mg daily.  - add imdur 30 mg daily for chest pain.   Anticipate hospital dismissal today if echocardiogram stable with regard to pericardial effusion.     For questions or updates, please contact [facility] Please consult [email or web address] for contact info under        Signed, Parke Poisson, MD  04/19/2021, 8:37 AM

## 2021-04-19 NOTE — Progress Notes (Signed)
  Echocardiogram 2D Echocardiogram has been performed.  Gerda Diss 04/19/2021, 9:34 AM

## 2021-04-21 ENCOUNTER — Telehealth: Payer: Self-pay

## 2021-04-21 ENCOUNTER — Encounter (HOSPITAL_COMMUNITY): Payer: Self-pay | Admitting: Internal Medicine

## 2021-04-21 NOTE — Telephone Encounter (Signed)
**Note De-identified Remi Lopata Obfuscation** -----  **Note De-Identified Ammi Hutt Obfuscation** Message from Marcelino Duster, [place] sent at 04/19/2021 11:46 AM EDT ----- Pt needs a TOC phone call and TOC appt in 7-14 days.   I believe I have made the appt correctly.  Thanks Angie

## 2021-04-21 NOTE — Telephone Encounter (Signed)
**Note De-Identified Holiday Mcmenamin Obfuscation** Patient contacted regarding discharge from [facility] on 04/19/2021.  Patient understands to follow up with Dr Mayford Knife on 04/30/2021 at 9:40 at [address] [place] in [place], [place] [address]. Patient understands discharge instructions? Yes Patient understands medications and regiment? Yes Patient understands to bring all medications to this visit? Yes  Ask patient:  Are you enrolled in My Chart: Yes  The pt states that he is doing well and is without CP, SOB, Nausea, diaphoresis, dizziness or headaches at this time. He does report that his wrist up to his elbow is bruised and that he has soreness around his cath site but denies redness, streaking, swelling, or temperature. He is advised to monitor his arm and that if his pain worsens or if he notices redness, swelling, streaking, puss, or fever at his cath site to contact Dr Malachy Mood office at [facility] at [phone].  The pt thanked me for my call.

## 2021-04-25 DIAGNOSIS — E119 Type 2 diabetes mellitus without complications: Secondary | ICD-10-CM | POA: Diagnosis not present

## 2021-04-25 DIAGNOSIS — I10 Essential (primary) hypertension: Secondary | ICD-10-CM | POA: Diagnosis not present

## 2021-04-29 DIAGNOSIS — M5030 Other cervical disc degeneration, unspecified cervical region: Secondary | ICD-10-CM | POA: Diagnosis not present

## 2021-04-29 DIAGNOSIS — K219 Gastro-esophageal reflux disease without esophagitis: Secondary | ICD-10-CM | POA: Diagnosis not present

## 2021-04-29 DIAGNOSIS — J449 Chronic obstructive pulmonary disease, unspecified: Secondary | ICD-10-CM | POA: Diagnosis not present

## 2021-04-29 DIAGNOSIS — E782 Mixed hyperlipidemia: Secondary | ICD-10-CM | POA: Diagnosis not present

## 2021-04-29 DIAGNOSIS — Z0001 Encounter for general adult medical examination with abnormal findings: Secondary | ICD-10-CM | POA: Diagnosis not present

## 2021-04-29 DIAGNOSIS — G4700 Insomnia, unspecified: Secondary | ICD-10-CM | POA: Diagnosis not present

## 2021-04-29 DIAGNOSIS — R7303 Prediabetes: Secondary | ICD-10-CM | POA: Diagnosis not present

## 2021-04-29 DIAGNOSIS — N1831 Chronic kidney disease, stage 3a: Secondary | ICD-10-CM | POA: Diagnosis not present

## 2021-04-29 DIAGNOSIS — F17218 Nicotine dependence, cigarettes, with other nicotine-induced disorders: Secondary | ICD-10-CM | POA: Diagnosis not present

## 2021-04-29 DIAGNOSIS — R7401 Elevation of levels of liver transaminase levels: Secondary | ICD-10-CM | POA: Diagnosis not present

## 2021-04-30 ENCOUNTER — Ambulatory Visit: Payer: Medicare Other | Admitting: Cardiology

## 2021-04-30 ENCOUNTER — Encounter: Payer: Self-pay | Admitting: Cardiology

## 2021-04-30 ENCOUNTER — Other Ambulatory Visit: Payer: Self-pay

## 2021-04-30 VITALS — BP 106/60 | HR 66 | Ht 72.0 in | Wt 211.8 lb

## 2021-04-30 DIAGNOSIS — I2583 Coronary atherosclerosis due to lipid rich plaque: Secondary | ICD-10-CM

## 2021-04-30 DIAGNOSIS — E785 Hyperlipidemia, unspecified: Secondary | ICD-10-CM | POA: Diagnosis not present

## 2021-04-30 DIAGNOSIS — I2510 Atherosclerotic heart disease of native coronary artery without angina pectoris: Secondary | ICD-10-CM | POA: Diagnosis not present

## 2021-04-30 DIAGNOSIS — I739 Peripheral vascular disease, unspecified: Secondary | ICD-10-CM | POA: Diagnosis not present

## 2021-04-30 MED ORDER — ISOSORBIDE MONONITRATE ER 60 MG PO TB24: 60.0000 mg | ORAL_TABLET | Freq: Every day | ORAL | 3 refills | Status: DC
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
## 2021-04-30 NOTE — Patient Instructions (Signed)
Medication Instructions:  Your physician has recommended you make the following change in your medication:  1) INCREASE Imdur (isosorbide) to 60 mg daily  *If you need a refill on your cardiac medications before your next appointment, please call your pharmacy*  Follow-Up: At [facility], you and your health needs are our priority.  As part of our continuing mission to provide you with exceptional heart care, we have created designated Provider Care Teams.  These Care Teams include your primary Cardiologist (physician) and Advanced Practice Providers (APPs -  Physician Assistants and Nurse Practitioners) who all work together to provide you with the care you need, when you need it.  Your next appointment:   6 month(s)  The format for your next appointment:   In Person  Provider:   You may see Armanda Magic, MD or one of the following Advanced Practice Providers on your designated Care Team:   Ronie Spies, PA-C Jacolyn Reedy, PA-C

## 2021-05-05 DIAGNOSIS — M1990 Unspecified osteoarthritis, unspecified site: Secondary | ICD-10-CM | POA: Diagnosis not present

## 2021-05-05 DIAGNOSIS — E1165 Type 2 diabetes mellitus with hyperglycemia: Secondary | ICD-10-CM | POA: Diagnosis not present

## 2021-06-02 ENCOUNTER — Telehealth: Payer: Self-pay | Admitting: *Deleted

## 2021-06-02 NOTE — Telephone Encounter (Signed)
Called to notify pt that AZ&ME application has been approved. No answer left msg to call back.

## 2021-06-04 DIAGNOSIS — E1165 Type 2 diabetes mellitus with hyperglycemia: Secondary | ICD-10-CM | POA: Diagnosis not present

## 2021-06-04 DIAGNOSIS — M1990 Unspecified osteoarthritis, unspecified site: Secondary | ICD-10-CM | POA: Diagnosis not present

## 2021-06-05 DIAGNOSIS — M545 Low back pain, unspecified: Secondary | ICD-10-CM | POA: Diagnosis not present

## 2021-06-10 NOTE — Telephone Encounter (Signed)
Patient calling the office for samples of medication:   1.  What medication and dosage are you requesting samples for? ticagrelor (BRILINTA) 90 MG TABS tablet  2.  Are you currently out of this medication?   Yes, Faith with Dr. Keane Police office states the patient is completely out of medication and patient assistance will not be able to deliver until January, 2023.

## 2021-06-10 NOTE — Telephone Encounter (Signed)
Brilinta Sample:  Lot #: [id number] Exp: 05/2022  Pt notified that samples are here in the [place] office and were ready to pick up.

## 2021-06-23 DIAGNOSIS — M545 Low back pain, unspecified: Secondary | ICD-10-CM | POA: Diagnosis not present

## 2021-06-23 DIAGNOSIS — R82998 Other abnormal findings in urine: Secondary | ICD-10-CM | POA: Diagnosis not present

## 2021-07-04 DIAGNOSIS — E1165 Type 2 diabetes mellitus with hyperglycemia: Secondary | ICD-10-CM | POA: Diagnosis not present

## 2021-07-04 DIAGNOSIS — M1990 Unspecified osteoarthritis, unspecified site: Secondary | ICD-10-CM | POA: Diagnosis not present

## 2021-07-23 DIAGNOSIS — E119 Type 2 diabetes mellitus without complications: Secondary | ICD-10-CM | POA: Diagnosis not present

## 2021-07-23 DIAGNOSIS — I10 Essential (primary) hypertension: Secondary | ICD-10-CM | POA: Diagnosis not present

## 2021-07-30 DIAGNOSIS — K219 Gastro-esophageal reflux disease without esophagitis: Secondary | ICD-10-CM | POA: Diagnosis not present

## 2021-07-30 DIAGNOSIS — M5030 Other cervical disc degeneration, unspecified cervical region: Secondary | ICD-10-CM | POA: Diagnosis not present

## 2021-07-30 DIAGNOSIS — M79604 Pain in right leg: Secondary | ICD-10-CM | POA: Diagnosis not present

## 2021-07-30 DIAGNOSIS — Z79899 Other long term (current) drug therapy: Secondary | ICD-10-CM | POA: Diagnosis not present

## 2021-07-30 DIAGNOSIS — J449 Chronic obstructive pulmonary disease, unspecified: Secondary | ICD-10-CM | POA: Diagnosis not present

## 2021-07-30 DIAGNOSIS — E782 Mixed hyperlipidemia: Secondary | ICD-10-CM | POA: Diagnosis not present

## 2021-07-30 DIAGNOSIS — R5383 Other fatigue: Secondary | ICD-10-CM | POA: Diagnosis not present

## 2021-07-30 DIAGNOSIS — N1831 Chronic kidney disease, stage 3a: Secondary | ICD-10-CM | POA: Diagnosis not present

## 2021-07-30 DIAGNOSIS — Z955 Presence of coronary angioplasty implant and graft: Secondary | ICD-10-CM | POA: Diagnosis not present

## 2021-07-30 DIAGNOSIS — G4700 Insomnia, unspecified: Secondary | ICD-10-CM | POA: Diagnosis not present

## 2021-07-30 DIAGNOSIS — F17218 Nicotine dependence, cigarettes, with other nicotine-induced disorders: Secondary | ICD-10-CM | POA: Diagnosis not present

## 2021-08-12 ENCOUNTER — Encounter: Payer: Self-pay | Admitting: Internal Medicine

## 2021-09-15 ENCOUNTER — Other Ambulatory Visit: Payer: Self-pay | Admitting: Internal Medicine

## 2021-09-30 ENCOUNTER — Other Ambulatory Visit: Payer: Self-pay

## 2021-09-30 ENCOUNTER — Encounter: Payer: Self-pay | Admitting: Cardiology

## 2021-09-30 ENCOUNTER — Ambulatory Visit (INDEPENDENT_AMBULATORY_CARE_PROVIDER_SITE_OTHER): Payer: Medicare Other | Admitting: Cardiology

## 2021-09-30 DIAGNOSIS — E785 Hyperlipidemia, unspecified: Secondary | ICD-10-CM

## 2021-09-30 DIAGNOSIS — I2583 Coronary atherosclerosis due to lipid rich plaque: Secondary | ICD-10-CM | POA: Diagnosis not present

## 2021-09-30 DIAGNOSIS — I2510 Atherosclerotic heart disease of native coronary artery without angina pectoris: Secondary | ICD-10-CM

## 2021-09-30 MED ORDER — OMEGA-3-ACID ETHYL ESTERS 1 G PO CAPS: 2.0000 g | ORAL_CAPSULE | Freq: Two times a day (BID) | ORAL | 3 refills | Status: DC

## 2021-09-30 NOTE — Assessment & Plan Note (Addendum)
Coronary CTA form showing a coronary calcium score of 895 which was 90th percentile for age and sex matched controls.  He was found to have an occluded RCA and likely obstructive disease in the LAD.   ?-S/P cath showing occluded proximal RCA with right to right collaterals as well as an 80% proximal to mid LAD.  He underwent PCI with drug-eluting stent to the proximal and mid LAD x2.   ? ?According to Dr. Norris Cross prior visit he was very frustrated that he was still having pain in his scapula with radiation to his left and right shoulder and arms.  He has severe DJD and chronic pain throughout his spine.  He did not want any more invasive procedures done and a Lexiscan stress test was ordered but I do not see it performed.  She also recommended at the time to increase his isosorbide to 60 mg a day.   ? ?He seems to be doing better with the increased isosorbide 60 mg.  It still hard for him to tell whether or not his arthritic pain or anginal pain.  We will continue with current medications.  No further stress test necessary at this time. ? ?Cardiac catheterization personally reviewed and interpreted- there was extravasation of contrast in LAD, second stent placed successfully. ?
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
## 2021-09-30 NOTE — Assessment & Plan Note (Addendum)
Goal LDL less than 70.  Crestor 40.  Fenofibrate 145.  Triglycerides 325, LDL 77 back in October 2022.  We will try Lovaza 2 g twice a day.  He was having trouble swallowing over-the-counter fish oil.  He is going to see a GI specialist about this.  He thinks it is because of his prior drinking and smoking.  We will check a lipid panel again in about 2 months. ?

## 2021-09-30 NOTE — Patient Instructions (Addendum)
Medication Instructions:  ?Lovaza 2g tablets twice daily ? ?Labwork: ?In two months: ?-Lipid Panel ? ?Follow-Up: ?Follow up with Randall An, PA-C in 6 months.  ? ?Any Other Special Instructions Will Be Listed Below (If Applicable). ? ? ? ? ?If you need a refill on your cardiac medications before your next appointment, please call your pharmacy. ? ?

## 2021-10-01 ENCOUNTER — Ambulatory Visit: Payer: Medicare Other | Admitting: Cardiology

## 2021-10-14 DIAGNOSIS — Z955 Presence of coronary angioplasty implant and graft: Secondary | ICD-10-CM | POA: Diagnosis not present

## 2021-10-14 DIAGNOSIS — I208 Other forms of angina pectoris: Secondary | ICD-10-CM | POA: Diagnosis not present

## 2021-10-14 DIAGNOSIS — L259 Unspecified contact dermatitis, unspecified cause: Secondary | ICD-10-CM | POA: Diagnosis not present
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
## 2021-11-26 ENCOUNTER — Encounter: Payer: Self-pay | Admitting: Gastroenterology

## 2021-11-26 ENCOUNTER — Ambulatory Visit: Payer: Medicare Other | Admitting: Gastroenterology

## 2021-11-26 VITALS — BP 102/62 | HR 56 | Temp 97.5°F | Ht 72.0 in | Wt 199.4 lb

## 2021-11-26 DIAGNOSIS — R1310 Dysphagia, unspecified: Secondary | ICD-10-CM | POA: Diagnosis not present

## 2021-11-26 NOTE — Patient Instructions (Signed)
We will plan to see you back in about 5 months to discuss scheduling an upper endoscopy for evaluation of your swallowing problems.   Avoid known dietary triggers of your swallowing problems.   Swallowing precautions:  Eat slowly, take small bites, chew thoroughly, drink plenty of liquids throughout meals.  Avoid trough textures All meats should be chopped finely.  If something gets hung in your esophagus and will not come up or go down, proceed to the emergency room.    Let me know if you have any worsening if your swallowing problems prior to your next visit.    It was nice meeting you today!    Ermalinda Memos, PA-C [facility]

## 2021-12-12 ENCOUNTER — Other Ambulatory Visit: Payer: Self-pay | Admitting: Internal Medicine

## 2022-01-10 ENCOUNTER — Other Ambulatory Visit: Payer: Self-pay | Admitting: Cardiology

## 2022-01-12 ENCOUNTER — Other Ambulatory Visit: Payer: Self-pay

## 2022-01-12 MED ORDER — ISOSORBIDE MONONITRATE ER 60 MG PO TB24: 60.0000 mg | ORAL_TABLET | Freq: Every day | ORAL | 2 refills | Status: DC

## 2022-01-14 DIAGNOSIS — I10 Essential (primary) hypertension: Secondary | ICD-10-CM | POA: Diagnosis not present

## 2022-01-14 DIAGNOSIS — E119 Type 2 diabetes mellitus without complications: Secondary | ICD-10-CM | POA: Diagnosis not present

## 2022-01-14 DIAGNOSIS — E782 Mixed hyperlipidemia: Secondary | ICD-10-CM | POA: Diagnosis not present

## 2022-01-15 ENCOUNTER — Ambulatory Visit: Payer: Medicare Other | Admitting: Cardiology

## 2022-01-19 DIAGNOSIS — G4700 Insomnia, unspecified: Secondary | ICD-10-CM | POA: Diagnosis not present

## 2022-01-19 DIAGNOSIS — Z87891 Personal history of nicotine dependence: Secondary | ICD-10-CM | POA: Diagnosis not present

## 2022-01-19 DIAGNOSIS — Z0001 Encounter for general adult medical examination with abnormal findings: Secondary | ICD-10-CM | POA: Diagnosis not present

## 2022-01-19 DIAGNOSIS — K219 Gastro-esophageal reflux disease without esophagitis: Secondary | ICD-10-CM | POA: Diagnosis not present

## 2022-01-19 DIAGNOSIS — Z955 Presence of coronary angioplasty implant and graft: Secondary | ICD-10-CM | POA: Diagnosis not present

## 2022-01-19 DIAGNOSIS — M79604 Pain in right leg: Secondary | ICD-10-CM | POA: Diagnosis not present

## 2022-01-19 DIAGNOSIS — E782 Mixed hyperlipidemia: Secondary | ICD-10-CM | POA: Diagnosis not present

## 2022-01-19 DIAGNOSIS — J449 Chronic obstructive pulmonary disease, unspecified: Secondary | ICD-10-CM | POA: Diagnosis not present

## 2022-03-11 IMAGING — DX DG CHEST 2V
2 series · 2 of 2 positions shown · non-contrast
Comparison: 12/06/2015.

CLINICAL DATA: Shortness of breath.

EXAM:
CHEST - 2 VIEW

[chest pa]
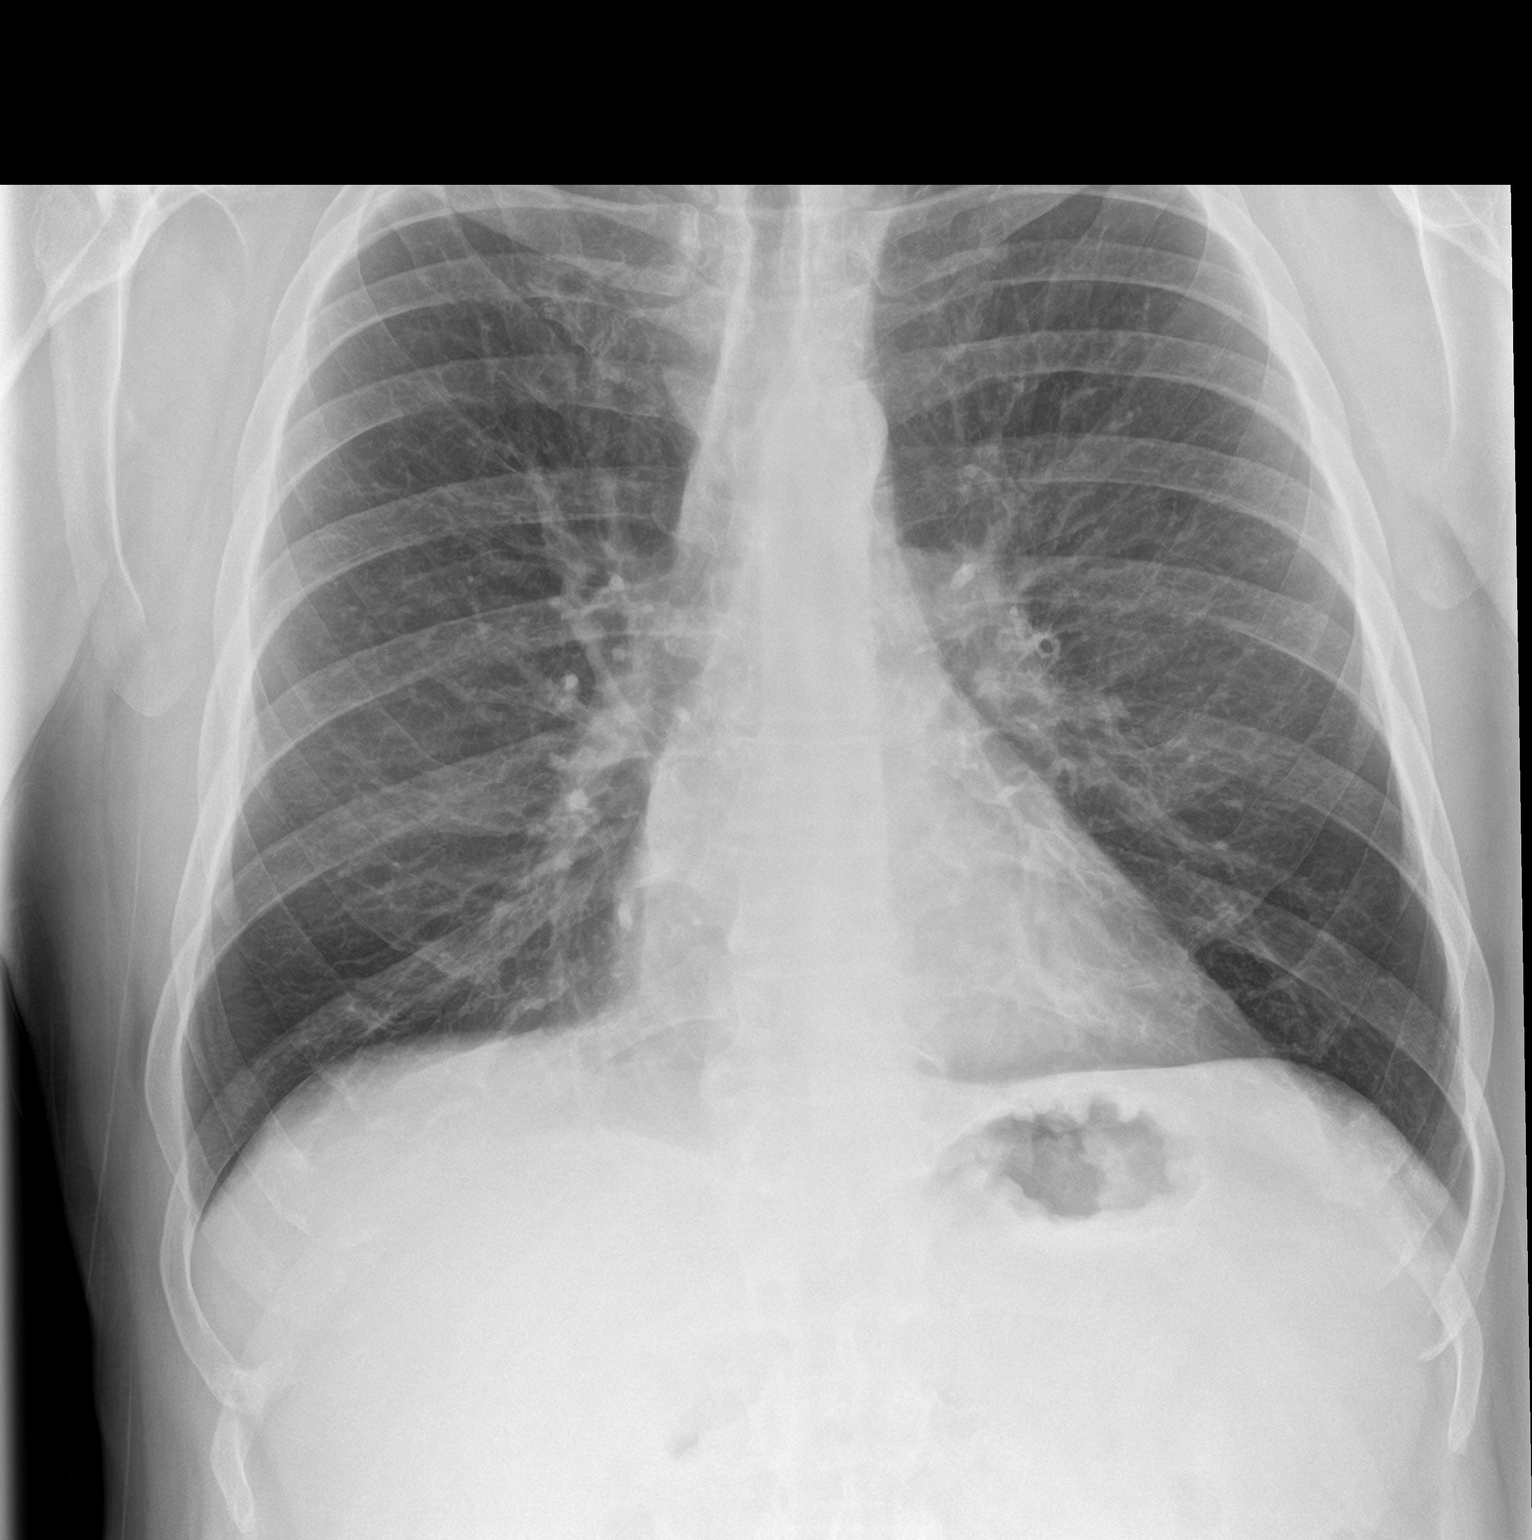

[chest lat]
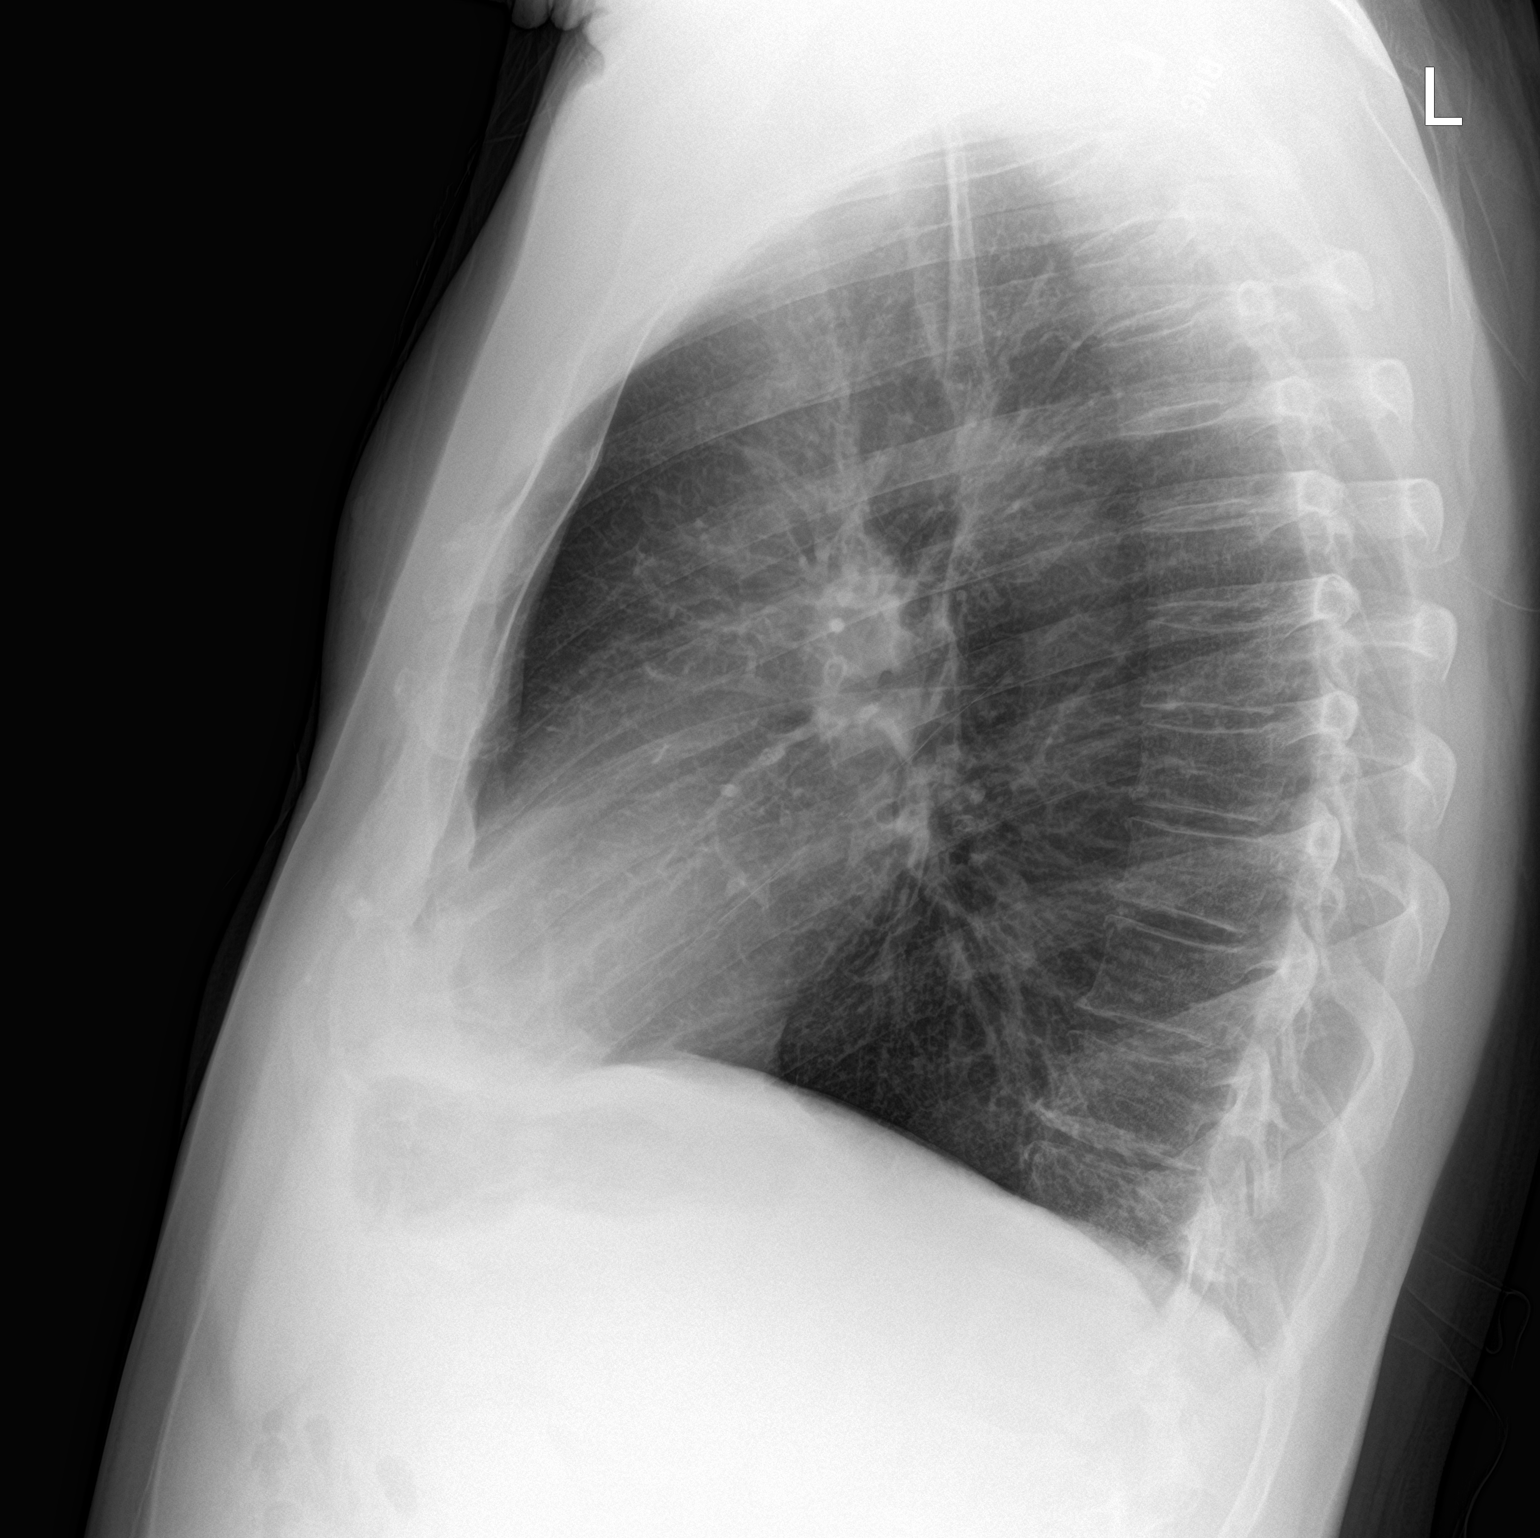

[2 of 2 positions shown; findings below may reference images not displayed]

FINDINGS: Trachea is midline. Heart size normal. Lungs are hyperinflated but
clear. No pleural fluid.
IMPRESSION: Hyperinflation without acute finding.

## 2022-03-17 ENCOUNTER — Encounter: Payer: Self-pay | Admitting: Student

## 2022-03-17 ENCOUNTER — Encounter: Payer: Self-pay | Admitting: *Deleted

## 2022-03-17 ENCOUNTER — Ambulatory Visit: Payer: Medicare Other | Attending: Student | Admitting: Student

## 2022-03-17 VITALS — BP 124/80 | HR 55 | Ht 72.0 in | Wt 203.6 lb

## 2022-03-17 DIAGNOSIS — E785 Hyperlipidemia, unspecified: Secondary | ICD-10-CM | POA: Diagnosis not present

## 2022-03-17 DIAGNOSIS — R079 Chest pain, unspecified: Secondary | ICD-10-CM

## 2022-03-17 DIAGNOSIS — I25118 Atherosclerotic heart disease of native coronary artery with other forms of angina pectoris: Secondary | ICD-10-CM

## 2022-03-17 DIAGNOSIS — I10 Essential (primary) hypertension: Secondary | ICD-10-CM
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
## 2022-03-17 NOTE — Patient Instructions (Signed)
Medication Instructions:  Your physician recommends that you continue on your current medications as directed. Please refer to the Current Medication list given to you today.  *If you need a refill on your cardiac medications before your next appointment, please call your pharmacy*   Lab Work: NONE   If you have labs (blood work) drawn today and your tests are completely normal, you will receive your results only by: MyChart Message (if you have MyChart) OR A paper copy in the mail If you have any lab test that is abnormal or we need to change your treatment, we will call you to review the results.   Testing/Procedures: NONE    Follow-Up: At [facility], you and your health needs are our priority.  As part of our continuing mission to provide you with exceptional heart care, we have created designated Provider Care Teams.  These Care Teams include your primary Cardiologist (physician) and Advanced Practice Providers (APPs -  Physician Assistants and Nurse Practitioners) who all work together to provide you with the care you need, when you need it.  We recommend signing up for the patient portal called "MyChart".  Sign up information is provided on this After Visit Summary.  MyChart is used to connect with patients for Virtual Visits (Telemedicine).  Patients are able to view lab/test results, encounter notes, upcoming appointments, etc.  Non-urgent messages can be sent to your provider as well.   To learn more about what you can do with MyChart, go to [email or web address].    Your next appointment:   6 month(s)  The format for your next appointment:   In Person  Provider:   Randall An, PA-C or Dr. Anne Fu     Other Instructions Thank you for choosing [facility]!    Important Information About Sugar

## 2022-03-19 ENCOUNTER — Encounter: Payer: Self-pay | Admitting: Internal Medicine

## 2022-03-20 ENCOUNTER — Telehealth: Payer: Self-pay | Admitting: Student

## 2022-03-20 NOTE — Telephone Encounter (Signed)
    Please let the patient know I reviewed with Dr. Anne Fu and he can stop Brilinta after 04/18/2022. Continue all other cardiac medications.   Signed, Ellsworth Lennox, PA-C 03/20/2022, 7:43 AM

## 2022-03-20 NOTE — Telephone Encounter (Signed)
Called pt to notify med changes. No answer. Left msg to call back.

## 2022-03-20 NOTE — Telephone Encounter (Signed)
Pt notified to stop Brilinta on 04/18/22. Pt voiced understanding.

## 2022-03-25 ENCOUNTER — Encounter: Payer: Self-pay | Admitting: *Deleted

## 2022-04-01 ENCOUNTER — Encounter: Payer: Self-pay | Admitting: Internal Medicine

## 2022-04-01 ENCOUNTER — Ambulatory Visit: Payer: Medicare Other | Admitting: Internal Medicine

## 2022-04-01 DIAGNOSIS — J449 Chronic obstructive pulmonary disease, unspecified: Secondary | ICD-10-CM | POA: Diagnosis not present

## 2022-04-01 DIAGNOSIS — Z87891 Personal history of nicotine dependence: Secondary | ICD-10-CM | POA: Insufficient documentation

## 2022-04-01 NOTE — Assessment & Plan Note (Signed)
Referred for LDSCT  04/01/2022 >>>   Low-dose CT lung cancer screening is recommended for patients who are 32-58 years of age with a 20+ pack-year history of smoking and who are currently smoking or quit <=15 years ago. No coughing up blood  No unintentional weight loss of > 15 pounds in the last 6 months - pt is eligible for scanning yearly until 2036> referred       F/u pulmonary clinic q 12 m - call sooner if needed    Each maintenance medication was reviewed in detail including emphasizing most importantly the difference between maintenance and prns and under what circumstances the prns are to be triggered using an action plan format where appropriate.  Total time for H and P, chart review, counseling, reviewing hfa device(s) and generating customized AVS unique to this office visit / same day charting > 30 min annual eval

## 2022-04-01 NOTE — Progress Notes (Unsigned)
Bobby Gonzales, male    DOB: 02/21/1966    MRN: [id number]   Brief patient profile:  56 yowm quit smoking 07/2019 @ wt 196  with onset of doe @ 2009 assoc wheeze >  Better on advair but wore off after a few years and then changed to breztri but no better so referred to pulmonary clinic in [place]  08/15/2020 by Dr  Bobby Gonzales office with GOLD II criteria 02/2020     History of Present Illness  08/15/2020  Pulmonary/ 1st office eval/ Bobby Gonzales / [place] [facility] re GOLD II copd  Chief Complaint  Patient presents with   Consult    Shortness of breath with activity since July 2021  Dyspnea:  MMRC2 = can't walk a nl pace on a flat grade s sob but does fine slow and flat  - legs often stop him first/ prednisone always helps Cough: at times  Sleep: on side / bed is flat  SABA use: saba  Up to 3-4 x per day p ex/ neb bid  rec Plan A = Automatic = Always=  Breztri Take 2 puffs first thing in am and then another 2 puffs about 12 hours later.  Work on inhaler technique:   After you use up the breztri,  Change to Trelegy one click each am Depomedrol 120 mg IM today  Plan B = Backup (to supplement plan A, not to replace it) Only use your albuterol inhaler as a rescue medication  Plan C = Crisis (instead of Plan B but only if Plan B stops working) - only use your albuterol nebulizer (8 x stronger) if you first try Plan B and it fails to help > ok to use the nebulizer up to every 4 hours but if start needing it regularly call for immediate appointment    03/26/2021  f/u ov/[place] office/Bobby Gonzales re: GOLD  2 COPD maint on breztri 2 bid   Chief Complaint  Patient presents with   Follow-up    Pt says he has noticed pain around his collar bone and center of chest since last visit. No sob or cough to report   Dyspnea:  10 min medium pace/ slowed by hips  Cp with weed eating x 3 weeks x 10 min s nausea/ diphoresis or radiation on pepcid 20 mg bid  Cough: none  Sleeping: bed is flat, 2 pillows  SABA use:  "you told me not to use it" / no neb at all/ rarely saba 02: none  Covid status: never vax / original infection  Rec Lopressor 25 mg twice daily until you see a heart doctor and continue your aspirin daily  We will be referring you to [facility]  - go to ER if chest pain does not go away at rest or happens with less and less exertion.  Plan A = Automatic = Always=  Breztri Take 2 puffs first thing in am and then another 2 puffs about 12 hours later.  Plan B = Backup (to supplement plan A, not to replace it) Only use your albuterol inhaler as a rescue medication Plan C = Crisis (instead of Plan B but only if Plan B stops working) - only use your albuterol nebulizer (8 x stronger) if you first try Plan B         04/01/2022  yearly f/u ov/[place] office/Bobby Gonzales re: GOLD 2 COPD  maint on Breztri   Chief Complaint  Patient presents with   Follow-up    Patient  feels breathing is about the same since being seen last year   Dyspnea:  slowed by hips / yardwork eg weed eating x  Cough: none  Sleeping: flat bed, 2 pillows  SABA use: none  02: none  Covid status: vax declined  Lung cancer screening: referred today    No obvious day to day or daytime variability or assoc excess/ purulent sputum or mucus plugs or hemoptysis or cp or chest tightness, subjective wheeze or overt sinus or hb symptoms.   Sleeping  without nocturnal  or early am exacerbation  of respiratory  c/o's or need for noct saba. Also denies any obvious fluctuation of symptoms with weather or environmental changes or other aggravating or alleviating factors except as outlined above   No unusual exposure hx or h/o childhood pna/ asthma or knowledge of premature birth.  Current Allergies, Complete Past Medical History, Past Surgical History, Family History, and Social History were reviewed in [employer] record.  ROS  The following are not active complaints unless  bolded Hoarseness, sore throat, dysphagia, dental problems, itching, sneezing,  nasal congestion or discharge of excess mucus or purulent secretions, ear ache,   fever, chills, sweats, unintended wt loss or wt gain, classically pleuritic or exertional cp,  orthopnea pnd or arm/hand swelling  or leg swelling, presyncope, palpitations, abdominal pain, anorexia, nausea, vomiting, diarrhea  or change in bowel habits or change in bladder habits, change in stools or change in urine, dysuria, hematuria,  rash, arthralgias, visual complaints, headache, numbness, weakness or ataxia or problems with walking or coordination,  change in mood or  memory.        Current Meds  Medication Sig   acetaminophen (TYLENOL) 650 MG CR tablet Take 1,300 mg by mouth at bedtime. 1900   albuterol (VENTOLIN HFA) 108 (90 Base) MCG/ACT inhaler Inhale 1-2 puffs into the lungs every 6 (six) hours as needed for wheezing or shortness of breath.   ALPRAZolam (XANAX) 1 MG tablet Take 2 mg by mouth at bedtime.   aspirin EC 81 MG tablet Take 81 mg by mouth at bedtime. Swallow whole.   betamethasone dipropionate (DIPROLENE) 0.05 % cream Apply 1 application. topically daily as needed (Rash).   Budeson-Glycopyrrol-Formoterol (BREZTRI AEROSPHERE) 160-9-4.8 MCG/ACT AERO Inhale 2 puffs into the lungs daily. Take 2 puffs first thing in am and then another 2 puffs about 12 hours later. (Patient taking differently: Inhale 2 puffs into the lungs every 12 (twelve) hours.)   famotidine (PEPCID) 20 MG tablet TAKE 1 TABLET BY MOUTH AFTER BREAKFAST AND AFTER SUPPER   fenofibrate 160 MG tablet Take 160 mg by mouth daily.   furosemide (LASIX) 20 MG tablet Take 20 mg by mouth.   isosorbide mononitrate (IMDUR) 60 MG 24 hr tablet Take 1 tablet (60 mg total) by mouth daily.   metoprolol tartrate (LOPRESSOR) 25 MG tablet Take 1 tablet (25 mg total) by mouth 2 (two) times daily.   rosuvastatin (CRESTOR) 40 MG tablet Take 1 tablet (40 mg total) by mouth at  bedtime.   ticagrelor (BRILINTA) 90 MG TABS tablet Take 1 tablet (90 mg total) by mouth 2 (two) times daily.   traZODone (DESYREL) 150 MG tablet Take 150 mg by mouth at bedtime.                  Past Medical History:  Diagnosis Date   Anxiety    COPD (chronic obstructive pulmonary disease) (HCC)    Depression    Hypercholesterolemia  Insomnia    Jock itch        Objective:     04/01/2022       207   03/26/2021      218   09/10/20 215 lb (97.5 kg)  08/15/20 214 lb (97.1 kg)  09/15/17 216 lb (98 kg)    Vital signs reviewed  04/01/2022  - Note at rest 02 sats  97% on RA   General appearance:    amb wm nad      HEENT : Oropharynx  clear  Nasal turbinates nl    NECK :  without  apparent JVD/ palpable Nodes/TM    LUNGS: no acc muscle use,  Mild barrel  contour chest wall with bilateral  Distant bs s audible wheeze and  without cough on insp or exp maneuvers  and mild  Hyperresonant  to  percussion bilaterally     CV:  RRR  no s3 or murmur or increase in P2, and no edema   ABD:  soft and nontender with pos end  insp Hoover's  in the supine position.  No bruits or organomegaly appreciated   MS:  Nl gait/ ext warm without deformities Or obvious joint restrictions  calf tenderness, cyanosis or clubbing     SKIN: warm and dry without lesions    NEURO:  alert, approp, nl sensorium with  no motor or cerebellar deficits apparent.       I personally reviewed images and agree with radiology impression as follows:   Chest CT cuts on coronary ct 04/15/21 No pleural fluid. Moderate basilar predominant bronchial wall thickening.   Mild centrilobular emphysema. Subtle micronodularity, most likely secondary to smoking related respiratory bronchiolitis      Assessment

## 2022-04-01 NOTE — Patient Instructions (Signed)
My office will be contacting you by phone for referral for lung cancer screening   - if you don't hear back from my office within one week please call us back or notify us thru MyChart and we'll address it right away   Please schedule a follow up visit in 12 months but call sooner if needed

## 2022-04-02 ENCOUNTER — Encounter: Payer: Self-pay | Admitting: Internal Medicine

## 2022-04-02 NOTE — Assessment & Plan Note (Signed)
Quit smoking 07/2019  - PFT's  02/27/20  FEV1 2.55 (63 % ) ratio 0.62  p 6 % improvement from saba p ? prior to study with DLCO  24.81 (81%) corrects to 3.46 (80 %)  for alv volume and FV curve classically concave  - 08/15/2020  After extensive coaching inhaler device,  effectiveness =    85% > try breztri 2bid samples x 2 weeks then trelegy x 2 weeks then return for re-eval - 09/10/2020  After extensive coaching inhaler device,  effectiveness =    95%   Group D (now reclassified as E) in terms of symptom/risk and laba/lama/ICS  therefore appropriate rx at this point >>>  Continue breztri and approp saba

## 2022-04-06 ENCOUNTER — Telehealth: Payer: Self-pay | Admitting: Physician Assistant

## 2022-04-06 MED ORDER — ROSUVASTATIN CALCIUM 40 MG PO TABS: 40.0000 mg | ORAL_TABLET | Freq: Every day | ORAL | 3 refills | Status: DC

## 2022-04-06 NOTE — Telephone Encounter (Signed)
Completed.

## 2022-04-06 NOTE — Telephone Encounter (Signed)
*  STAT* If patient is at the pharmacy, call can be transferred to refill team.   1. Which medications need to be refilled? (please list name of each medication and dose if known) rosuvastatin (CRESTOR) 40 MG tablet  2. Which pharmacy/location (including street and city if local pharmacy) is medication to be sent to?  [facility] [place], [place]  3. Do they need a 30 day or 90 day supply? [place]

## 2022-04-15 ENCOUNTER — Telehealth: Payer: Self-pay | Admitting: Cardiology

## 2022-04-15 MED ORDER — ISOSORBIDE MONONITRATE ER 60 MG PO TB24: 60.0000 mg | ORAL_TABLET | Freq: Every day | ORAL | 2 refills | Status: DC

## 2022-04-15 NOTE — Telephone Encounter (Signed)
Pt c/o medication issue:  1. Name of Medication: ticagrelor (BRILINTA) 90 MG TABS tablet  2. How are you currently taking this medication (dosage and times per day)? Take 1 tablet (90 mg total) by mouth 2 (two) times daily.  3. Are you having a reaction (difficulty breathing--STAT)? no  4. What is your medication issue? Calling to see if still suppose to be taking this medication. Please advise

## 2022-04-15 NOTE — Telephone Encounter (Signed)
Completed.

## 2022-04-15 NOTE — Telephone Encounter (Signed)
*  STAT* If patient is at the pharmacy, call can be transferred to refill team.   1. Which medications need to be refilled? (please list name of each medication and dose if known) isosorbide mononitrate (IMDUR) 60 MG 24 hr tablet  2. Which pharmacy/location (including street and city if local pharmacy) is medication to be sent to? [facility] [place], [place] [address] [place]  3. Do they need a 30 day or 90 day supply? [place]

## 2022-04-15 NOTE — Telephone Encounter (Signed)
Please let the patient know I reviewed with Dr. Marlou Porch and he can stop Brilinta after 04/18/2022. Continue all other cardiac medications.    Signed, Erma Heritage, PA-C 03/20/2022, 7:43 AM  Patient notified and verbalized understanding. Patient had no other questions or concerns at this time.

## 2022-04-17 DIAGNOSIS — Z0000 Encounter for general adult medical examination without abnormal findings: Secondary | ICD-10-CM | POA: Diagnosis not present

## 2022-07-16 DIAGNOSIS — N1831 Chronic kidney disease, stage 3a: Secondary | ICD-10-CM | POA: Diagnosis not present

## 2022-07-16 DIAGNOSIS — R7303 Prediabetes: Secondary | ICD-10-CM | POA: Diagnosis not present

## 2022-07-22 ENCOUNTER — Other Ambulatory Visit (HOSPITAL_COMMUNITY): Payer: Self-pay | Admitting: Internal Medicine

## 2022-07-22 DIAGNOSIS — G4700 Insomnia, unspecified: Secondary | ICD-10-CM | POA: Diagnosis not present

## 2022-07-22 DIAGNOSIS — Z955 Presence of coronary angioplasty implant and graft: Secondary | ICD-10-CM | POA: Diagnosis not present

## 2022-07-22 DIAGNOSIS — Z87891 Personal history of nicotine dependence: Secondary | ICD-10-CM | POA: Diagnosis not present

## 2022-07-22 DIAGNOSIS — E782 Mixed hyperlipidemia: Secondary | ICD-10-CM | POA: Diagnosis not present

## 2022-07-22 DIAGNOSIS — K219 Gastro-esophageal reflux disease without esophagitis: Secondary | ICD-10-CM | POA: Diagnosis not present

## 2022-07-22 DIAGNOSIS — N1831 Chronic kidney disease, stage 3a: Secondary | ICD-10-CM | POA: Diagnosis not present

## 2022-07-22 DIAGNOSIS — J449 Chronic obstructive pulmonary disease, unspecified: Secondary | ICD-10-CM | POA: Diagnosis not present

## 2022-07-22 DIAGNOSIS — R7303 Prediabetes: Secondary | ICD-10-CM | POA: Diagnosis not present

## 2022-07-22 DIAGNOSIS — I10 Essential (primary) hypertension: Secondary | ICD-10-CM | POA: Diagnosis not present

## 2022-07-22 DIAGNOSIS — M5030 Other cervical disc degeneration, unspecified cervical region: Secondary | ICD-10-CM | POA: Diagnosis not present

## 2022-07-23 ENCOUNTER — Telehealth: Payer: Self-pay | Admitting: *Deleted

## 2022-07-23 DIAGNOSIS — E785 Hyperlipidemia, unspecified: Secondary | ICD-10-CM

## 2022-07-23 NOTE — Telephone Encounter (Signed)
Pt notified and order placed 

## 2022-07-23 NOTE — Telephone Encounter (Signed)
-----  Message from Charlie Pitter, PA-C sent at 07/22/2022  5:25 PM EST ----- Covering B Strader's inbox. PCP labs reviewed. LDL not at goal at 74, triglycerides remain high. If PCP did not advise, would suggest referral to lipid clinic (pharmd) if patient agreeable as he is already on rosuvastatin 40mg  and fenofibrate. Thank you!

## 2022-09-04 ENCOUNTER — Ambulatory Visit (HOSPITAL_COMMUNITY)
Admission: RE | Admit: 2022-09-04 | Discharge: 2022-09-04 | Disposition: A | Discharge status: Home / Self Care | Payer: Medicare Other | Source: Ambulatory Visit | Attending: Internal Medicine | Admitting: Internal Medicine

## 2022-09-04 DIAGNOSIS — Z87891 Personal history of nicotine dependence: Secondary | ICD-10-CM | POA: Diagnosis not present

## 2022-11-11 ENCOUNTER — Ambulatory Visit: Payer: Medicare Other | Attending: Student | Admitting: Student

## 2022-11-11 ENCOUNTER — Ambulatory Visit: Payer: Medicare Other | Admitting: Student

## 2022-11-11 ENCOUNTER — Encounter: Payer: Self-pay | Admitting: Student

## 2022-11-11 VITALS — BP 110/62 | HR 60 | Wt 208.0 lb

## 2022-11-11 DIAGNOSIS — N1831 Chronic kidney disease, stage 3a: Secondary | ICD-10-CM | POA: Diagnosis not present

## 2022-11-11 DIAGNOSIS — I10 Essential (primary) hypertension: Secondary | ICD-10-CM

## 2022-11-11 DIAGNOSIS — E785 Hyperlipidemia, unspecified: Secondary | ICD-10-CM | POA: Diagnosis not present

## 2022-11-11 DIAGNOSIS — I25118 Atherosclerotic heart disease of native coronary artery with other forms of angina pectoris: Secondary | ICD-10-CM

## 2022-11-11 NOTE — Patient Instructions (Signed)
Medication Instructions:  Your physician recommends that you continue on your current medications as directed. Please refer to the Current Medication list given to you today.   Labwork: None today  Testing/Procedures: None today  Follow-Up: 6 months  Any Other Special Instructions Will Be Listed Below (If Applicable).  If you need a refill on your cardiac medications before your next appointment, please call your pharmacy.  

## 2022-11-11 NOTE — Progress Notes (Signed)
Cardiology Office Note    Date:  11/11/2022  ID:  Bobby Gonzales, DOB 08/19/1965, MRN [id number] Cardiologist: Donato Schultz, MD  --> Patient previously requested to follow-up in [facility]  History of Present Illness:    Bobby Gonzales is a 57 y.o. male with past medical history of CAD (s/p DES x2 to proximal and mid LAD and noted to have CTO of RCA with R-->R collaterals by cath in 04/2021), HTN, HLD, Stage 3 CKD and COPD who presents to the office today for 26-month follow-up.  He was last examined by myself in 03/2022 and reported occasional episodes of chest pain which would last for a few seconds and spontaneously resolve but other episodes had occurred with activity. He had experienced intermittent pain since stent placement and denied any change in frequency or severity of symptoms. He was encouraged to make [place] aware if he had worsening symptoms as a follow-up Lexiscan Myoview could be arranged. He was continued on ASA, Crestor, Lopressor, Lasix and Imdur with plans to stop Brilinta in 04/2022 given he would be a year out since stent placement.  In talking with the patient today, he reports his biggest issue continues to be arthritis which goes along his entire body from his neck down to his toes. This does limit his activity. While he has occasional episodes of discomfort along his chest, he reports this typically only lasts for a few seconds and resolves. No exertional symptoms resembling his prior angina. No specific orthopnea, PND or pitting edema. He does take his Lasix at night as he does not like the frequent urination he experiences with this during the day. Says he does not consume a heart healthy diet and has no interest in changing his dietary habits at this time.   Studies Reviewed:   EKG: EKG is not ordered today.   LHC: 04/2021  Prox RCA lesion is 100% stenosed.   Mid LAD lesion is 80% stenosed.   Non-stenotic Prox LAD lesion.   A drug-eluting stent was successfully  placed using a STENT ONYX FRONTIER 3.0X26.   A stent was successfully placed.   A drug-eluting stent was successfully placed using a STENT ONYX FRONTIER 4.0X18.   Post intervention, there is a 0% residual stenosis.   Post intervention, there is a 0% residual stenosis.   LV end diastolic pressure is severely elevated.   1.  Chronic total occlusion of the right coronary artery with right to right bridging collaterals 2.  Highly calcified mid LAD lesion treated with 2 overlapping drug-eluting stents in conjunction with shockwave lithotripsy.  The procedure was complicated by a perforation that was successfully treated with the PK papyrus covered stent. 3.  Highly elevated LVEDP.     Recommendations: We will monitor overnight with limited echocardiogram in the morning.  Patient should continue DAPT for at least 6 months.  Limited Echo: 04/2021 IMPRESSIONS     1. Trivial pericardial effusion.   2. Left ventricular ejection fraction, by estimation, is 55 to 60%. The  left ventricle has normal function. The left ventricle demonstrates  regional wall motion abnormalities (see scoring diagram/findings for  description).   3. Right ventricular systolic function is normal. The right ventricular  size is normal.   4. The inferior vena cava is normal in size with greater than 50%  respiratory variability, suggesting right atrial pressure of 3 mmHg.    Physical Exam:   VS:  BP 110/62   Pulse 60   Wt 208 lb (94.3 kg)  SpO2 95%   BMI 28.21 kg/m    Wt Readings from Last 3 Encounters:  11/11/22 208 lb (94.3 kg)  04/01/22 207 lb 12.8 oz (94.3 kg)  03/17/22 203 lb 9.6 oz (92.4 kg)     GEN: Well nourished, well developed male appearing in no acute distress NECK: No JVD; No carotid bruits CARDIAC: RRR, no murmurs, rubs, gallops RESPIRATORY:  Clear to auscultation without rales, wheezing or rhonchi  ABDOMEN: Appears non-distended. No obvious abdominal masses. EXTREMITIES: No clubbing or  cyanosis. No pitting edema.  Distal pedal pulses are 2+ bilaterally.   Assessment and Plan:   1. CAD - He is s/p DES x2 to proximal and mid LAD and did have a CTO of the RCA with R-->R collaterals by cath in 04/2021.  - He has occasional episodes of shooting pain which last for a few seconds and overall seem atypical for a cardiac etiology. I encouraged him to make [place] aware of any recurrent symptoms resembling his prior angina as a follow-up stress test could be arranged. - Continue current medical therapy with ASA 81 mg daily, Fenofibrate 160 mg daily, Imdur 60 mg daily, Lopressor 25 mg twice daily and Crestor 40 mg daily. He is still listed as being on Brilinta but I did confirm with the patient that he stopped this last year.  2. HTN - His blood pressure is well-controlled at 110/62 during today's visit. Continue current medical therapy with Lasix 20 mg daily, Imdur 60 mg daily and Lopressor 25 mg twice daily.  3. HLD - FLP in 07/2022 showed total cholesterol 155, triglycerides 305, HDL 32 and LDL 74. At this time, he is currently taking Fenofibrate 160 mg daily and Crestor 40 mg daily. Reviewed other options but he is not interested in further medication changes at this time and is also not interested in making dietary changes. Would readdress in the future. We reviewed the association between cholesterol and CAD.  4. Stage 3 CKD - Creatinine was at 1.42 in 2023 and stable at 1.48 by repeat labs in 07/2022.  Signed, Ellsworth Lennox, PA-C

## 2022-12-17 DIAGNOSIS — J019 Acute sinusitis, unspecified: Secondary | ICD-10-CM | POA: Diagnosis not present

## 2022-12-17 DIAGNOSIS — Z87891 Personal history of nicotine dependence: Secondary | ICD-10-CM | POA: Diagnosis not present

## 2023-01-05 ENCOUNTER — Other Ambulatory Visit: Payer: Self-pay | Admitting: Cardiology

## 2023-01-14 DIAGNOSIS — E782 Mixed hyperlipidemia: Secondary | ICD-10-CM | POA: Diagnosis not present

## 2023-01-14 DIAGNOSIS — R7303 Prediabetes: Secondary | ICD-10-CM | POA: Diagnosis not present

## 2023-01-20 DIAGNOSIS — E782 Mixed hyperlipidemia: Secondary | ICD-10-CM | POA: Diagnosis not present

## 2023-01-20 DIAGNOSIS — N1831 Chronic kidney disease, stage 3a: Secondary | ICD-10-CM | POA: Diagnosis not present

## 2023-01-20 DIAGNOSIS — G4700 Insomnia, unspecified: Secondary | ICD-10-CM | POA: Diagnosis not present

## 2023-01-20 DIAGNOSIS — J449 Chronic obstructive pulmonary disease, unspecified: Secondary | ICD-10-CM | POA: Diagnosis not present

## 2023-01-20 DIAGNOSIS — Z955 Presence of coronary angioplasty implant and graft: Secondary | ICD-10-CM | POA: Diagnosis not present

## 2023-01-20 DIAGNOSIS — R7303 Prediabetes: Secondary | ICD-10-CM | POA: Diagnosis not present

## 2023-01-20 DIAGNOSIS — Z87891 Personal history of nicotine dependence: Secondary | ICD-10-CM | POA: Diagnosis not present

## 2023-01-20 DIAGNOSIS — M5030 Other cervical disc degeneration, unspecified cervical region: Secondary | ICD-10-CM | POA: Diagnosis not present

## 2023-01-20 DIAGNOSIS — K219 Gastro-esophageal reflux disease without esophagitis: Secondary | ICD-10-CM | POA: Diagnosis not present

## 2023-01-20 DIAGNOSIS — I129 Hypertensive chronic kidney disease with stage 1 through stage 4 chronic kidney disease, or unspecified chronic kidney disease: Secondary | ICD-10-CM | POA: Diagnosis not present

## 2023-02-02 ENCOUNTER — Ambulatory Visit: Payer: Medicare Other | Admitting: Student

## 2023-04-01 ENCOUNTER — Ambulatory Visit: Payer: Medicare Other | Admitting: Internal Medicine

## 2023-04-01 ENCOUNTER — Encounter: Payer: Self-pay | Admitting: Internal Medicine

## 2023-04-01 VITALS — BP 122/74 | HR 64 | Wt 211.8 lb

## 2023-04-01 DIAGNOSIS — J449 Chronic obstructive pulmonary disease, unspecified: Secondary | ICD-10-CM

## 2023-04-01 DIAGNOSIS — Z87891 Personal history of nicotine dependence: Secondary | ICD-10-CM

## 2023-04-01 DIAGNOSIS — R911 Solitary pulmonary nodule: Secondary | ICD-10-CM | POA: Diagnosis not present

## 2023-04-01 NOTE — Patient Instructions (Signed)
No change in medications   My office will be contacting you by phone for referral to CT Chest   - if you don't hear back from my office within one week please call us back or notify us thru MyChart and we'll address it right away.   Please schedule a follow up visit in 12  months but call sooner if needed

## 2023-04-01 NOTE — Progress Notes (Signed)
Bobby Gonzales, male    DOB: 19-Jan-1966    MRN: [id number]   Brief patient profile:  57  yowm quit smoking 07/2019 @ wt 196  with onset of doe @ 2009 assoc wheeze >  Better on advair but wore off after a few years and then changed to breztri but no better so referred to pulmonary clinic in [place]  08/15/2020 by Dr  Scharlene Gloss office with GOLD II criteria 02/2020     History of Present Illness  08/15/2020  Pulmonary/ 1st office eval/ [name] / [place] Office re GOLD II copd  Chief Complaint  Patient presents with   Consult    Shortness of breath with activity since July 2021  Dyspnea:  MMRC2 = can't walk a nl pace on a flat grade s sob but does fine slow and flat  - legs often stop him first/ prednisone always helps Cough: at times  Sleep: on side / bed is flat  SABA use: saba  Up to 3-4 x per day p ex/ neb bid  rec Plan A = Automatic = Always=  Breztri Take 2 puffs first thing in am and then another 2 puffs about 12 hours later.  Work on inhaler technique:   After you use up the breztri,  Change to Trelegy one click each am Depomedrol 120 mg IM today  Plan B = Backup (to supplement plan A, not to replace it) Only use your albuterol inhaler as a rescue medication  Plan C = Crisis (instead of Plan B but only if Plan B stops working) - only use your albuterol nebulizer (8 x stronger) if you first try Plan B and it fails to help > ok to use the nebulizer up to every 4 hours but if start needing it regularly call for immediate appointment    03/26/2021  f/u ov/[place] office/[place] [place] re: GOLD  2 COPD maint on breztri 2 bid   Chief Complaint  Patient presents with   Follow-up    Pt says he has noticed pain around his collar bone and center of chest since last visit. No sob or cough to report   Dyspnea:  10 min medium pace/ slowed by hips  Cp with weed eating x 3 weeks x 10 min s nausea/ diphoresis or radiation on pepcid 20 mg bid  Cough: none  Sleeping: bed is flat, 2 pillows  SABA use:  "you told me not to use it" / no neb at all/ rarely saba 02: none  Covid status: never vax / original infection  Rec Lopressor 25 mg twice daily until you see a heart doctor and continue your aspirin daily  We will be referring [facility] to [facility]  - go to ER if chest pain does not go away at rest or happens with less and less exertion.  Plan A = Automatic = Always=  Breztri Take 2 puffs first thing in am and then another 2 puffs about 12 hours later.  Plan B = Backup (to supplement plan A, not to replace it) Only use your albuterol inhaler as a rescue medication Plan C = Crisis (instead of Plan B but only if Plan B stops working) - only use your albuterol nebulizer (8 x stronger) if you first try Plan B       04/01/2023  f/u ov/[place] office/Hanni [place] re: GOLD 2 copd  maint on Breztri   f/u CT chest needed  Chief Complaint  Patient presents with   Follow-up  Dyspnea:  Not limited by breathing from desired activities  Cough: none  Sleeping: bed is flat no    resp cc  SABA use: none  02: none      No obvious day to day or daytime variability or assoc excess/ purulent sputum or mucus plugs or hemoptysis or cp or chest tightness, subjective wheeze or overt sinus or hb symptoms.    Also denies any obvious fluctuation of symptoms with weather or environmental changes or other aggravating or alleviating factors except as outlined above   No unusual exposure hx or h/o childhood pna/ asthma or knowledge of premature birth.  Current Allergies, Complete Past Medical History, Past Surgical History, Family History, and Social History were reviewed in [employer] record.  ROS  The following are not active complaints unless bolded Hoarseness, sore throat, dysphagia, dental problems, itching, sneezing,  nasal congestion or discharge of excess mucus or purulent secretions, ear ache,   fever, chills, sweats, unintended wt loss or wt gain, classically  pleuritic or exertional cp,  orthopnea pnd or arm/hand swelling  or leg swelling, presyncope, palpitations, abdominal pain, anorexia, nausea, vomiting, diarrhea  or change in bowel habits or change in bladder habits, change in stools or change in urine, dysuria, hematuria,  rash, arthralgias, visual complaints, headache, numbness, weakness or ataxia or problems with walking or coordination,  change in mood or  memory.        Current Meds  Medication Sig   acetaminophen (TYLENOL) 650 MG CR tablet Take 1,300 mg by mouth at bedtime. 1900   albuterol (VENTOLIN HFA) 108 (90 Base) MCG/ACT inhaler Inhale 1-2 puffs into the lungs every 6 (six) hours as needed for wheezing or shortness of breath.   ALPRAZolam (XANAX) 1 MG tablet Take 2 mg by mouth at bedtime.   aspirin EC 81 MG tablet Take 81 mg by mouth at bedtime. Swallow whole.   Budeson-Glycopyrrol-Formoterol (BREZTRI AEROSPHERE) 160-9-4.8 MCG/ACT AERO Inhale 2 puffs into the lungs daily. Take 2 puffs first thing in am and then another 2 puffs about 12 hours later. (Patient taking differently: Inhale 2 puffs into the lungs every 12 (twelve) hours.)   famotidine (PEPCID) 20 MG tablet TAKE 1 TABLET BY MOUTH AFTER BREAKFAST AND AFTER SUPPER   fenofibrate 160 MG tablet Take 160 mg by mouth daily.   furosemide (LASIX) 20 MG tablet Take 20 mg by mouth.   isosorbide mononitrate (IMDUR) 60 MG 24 hr tablet TAKE 1 TABLET(60 MG) BY MOUTH DAILY   metoprolol tartrate (LOPRESSOR) 25 MG tablet Take 1 tablet (25 mg total) by mouth 2 (two) times daily.   omeprazole (PRILOSEC) 20 MG capsule Take 20 mg by mouth daily as needed.   rosuvastatin (CRESTOR) 40 MG tablet Take 1 tablet (40 mg total) by mouth at bedtime.   traZODone (DESYREL) 150 MG tablet Take 150 mg by mouth at bedtime.                 Past Medical History:  Diagnosis Date   Anxiety    COPD (chronic obstructive pulmonary disease) (HCC)    Depression    Hypercholesterolemia    Insomnia    Jock  itch        Objective:    Wts  04/01/2023        211  04/01/2022       207   03/26/2021      218   09/10/20 215 lb (97.5 kg)  08/15/20 214 lb (97.1 kg)  09/15/17 216 lb (98 kg)    Vital signs reviewed  04/01/2023  - Note at rest 02 sats  92% on RA   General appearance:    amb wm nad   HEENT : Oropharynx  clear    NECK :  without  apparent JVD/ palpable Nodes/TM    LUNGS: no acc muscle use,  Mild barrel  contour chest wall with bilateral  Distant bs s audible wheeze and  without cough on insp or exp maneuvers  and mild  Hyperresonant  to  percussion bilaterally     CV:  RRR  no s3 or murmur or increase in P2, and no edema   ABD:  soft and nontender   MS:  Nl gait/ ext warm without deformities Or obvious joint restrictions  calf tenderness, cyanosis or clubbing     SKIN: warm and dry without lesions    NEURO:  alert, approp, nl sensorium with  no motor or cerebellar deficits apparent.         I personally reviewed images and agree with radiology impression as follows:   Chest LDSCT     09/04/2022 1. New right upper lobe pulmonary nodule categorized as Lung-RADS 4AS, suspicious. Follow up low-dose chest CT without contrast in 3 months (please use the following order, "CT CHEST LCS NODULE FOLLOW-UP W/O CM") is recommended. Alternatively, PET may be considered when there is a solid component 8mm or larger. 2. The "S" modifier above refers to potentially clinically significant non lung cancer related findings. Specifically, there is aortic atherosclerosis, in addition to left main and three-vessel coronary artery disease. Please note that although the presence of coronary artery calcium documents the presence of coronary artery disease, the severity of this disease and any potential stenosis cannot be assessed on this non-gated CT examination. Assessment for potential risk factor modification, dietary therapy or pharmacologic therapy may be warranted, if clinically  indicated. 3. Mild diffuse bronchial wall thickening with mild centrilobular and paraseptal emphysema; imaging findings suggestive of underlying COPD.         Assessment

## 2023-04-02 NOTE — Assessment & Plan Note (Signed)
Quit smoking 07/2019  - PFT's  02/27/20  FEV1 2.55 (63 % ) ratio 0.62  p 6 % improvement from saba p ? prior to study with DLCO  24.81 (81%) corrects to 3.46 (80 %)  for alv volume and FV curve classically concave  - 08/15/2020  After extensive coaching inhaler device,  effectiveness =    85% > try breztri 2bid samples x 2 weeks then trelegy x 2 weeks then return for re-eval - 09/10/2020  After extensive coaching inhaler device,  effectiveness =    95%   Group D (now reclassified as E) in terms of symptom/risk and laba/lama/ICS  therefore appropriate rx at this point >>>  continue breztri and approp saba

## 2023-04-06 ENCOUNTER — Telehealth: Payer: Self-pay | Admitting: Cardiology

## 2023-04-06 MED ORDER — ROSUVASTATIN CALCIUM 40 MG PO TABS: 40.0000 mg | ORAL_TABLET | Freq: Every day | ORAL | 3 refills | Status: DC

## 2023-04-06 NOTE — Telephone Encounter (Signed)
*  STAT* If patient is at the pharmacy, call can be transferred to refill team.   1. Which medications need to be refilled? (please list name of each medication and dose if known)   rosuvastatin (CRESTOR) 40 MG tablet   2. Would you like to learn more about the convenience, safety, & potential cost savings by using the [facility]?   3. Are you open to using the [facility] (Type [facility]. ).  4. Which pharmacy/location (including street and city if local pharmacy) is medication to be sent to?  [facility] [phone] - [place],  - [address] AT [facility] [address]   5. Do they need a 30 day or 90 day supply?   90 day  Patient stated he is almost out of this medication.

## 2023-04-06 NOTE — Telephone Encounter (Signed)
Completed.

## 2023-04-13 IMAGING — CT CT HEART MORP W/ CTA COR W/ SCORE W/ CA W/CM &/OR W/O CM
4 of 7 series · 8 of 20 positions shown, 9 images · IV contrast (APPLIED)
Comparison: 03/13/2020 chest radiograph.
COMPARISON: 03/13/2020 chest radiograph.

Addendum:
EXAM:
OVER-READ INTERPRETATION  CT CHEST

The following report is an over-read performed by radiologist Dr.
over-read does not include interpretation of cardiac or coronary
anatomy or pathology. The coronary CTA interpretation by the
cardiologist is attached.
CLINICAL DATA: Chest pain
Cardiac CTA
MEDICATIONS:
Sub lingual nitro. 4 mg and lopressor 25mg
TECHNIQUE: The patient was scanned on a Siemens Force 192 scanner. Gantry
rotation speed was 250 msecs. Collimation was. 6 mm . A 120 kV
prospective scan was triggered in the ascending thoracic aorta at
140 HU's with full mA between 30-70% of the R-R interval . Average
HR during the scan was 54 bpm. The 3D data set was interpreted on a
dedicated work station using MPR, MIP and VRT modes. A total of 80
cc of contrast was used.

[Series 6: best diast · axial · 0.39mm/px · z∈[+1337,+1382]mm · 2 of 333 slices shown, 3 images]
[im 111/333  vessel]
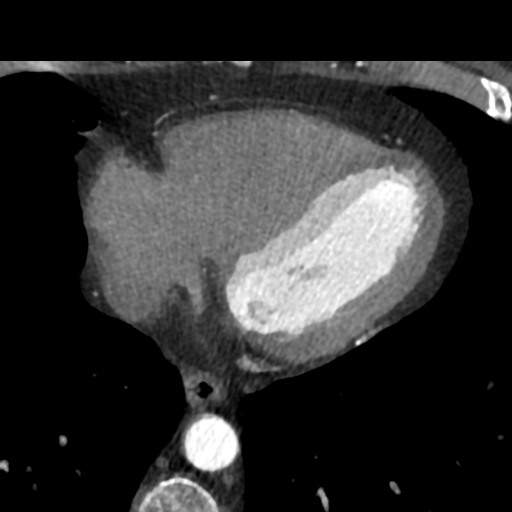
[im 111/333  lung]
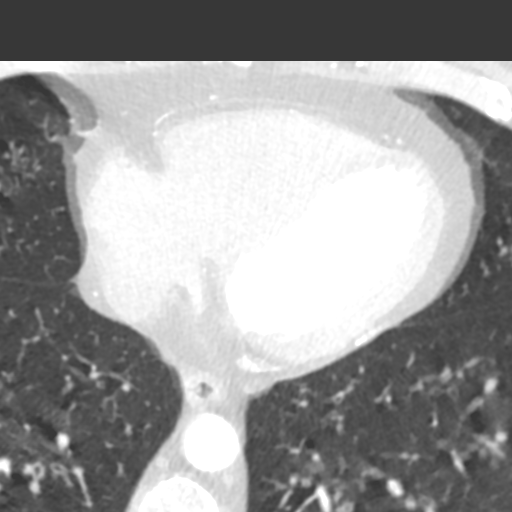
[im 222/333  vessel]
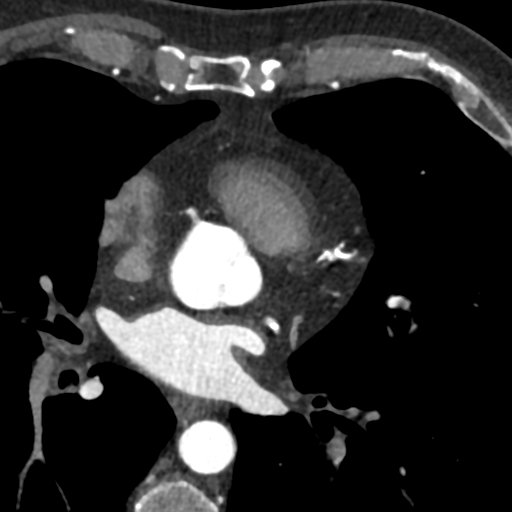

[Series 7: best syst · axial · 0.39mm/px · z∈[+1337,+1382]mm · 2 of 333 slices shown]
[im 111/333  vessel]
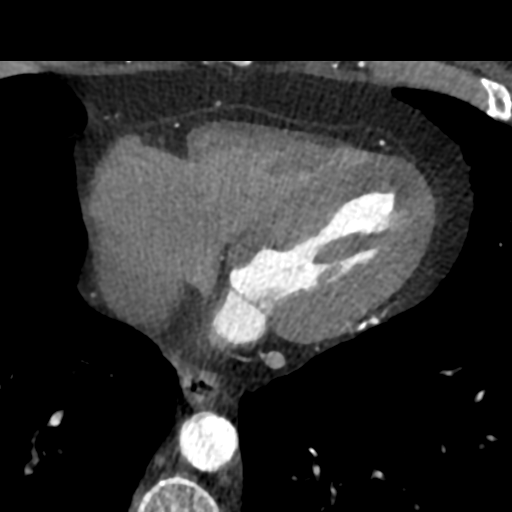
[im 222/333  vessel]
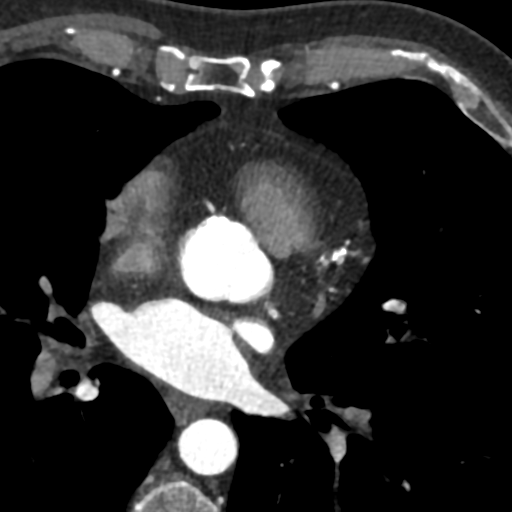

[Series 8: ts syst sharp · axial · 0.39mm/px · z∈[+1337,+1382]mm · 2 of 333 slices shown]
[im 111/333  lung]
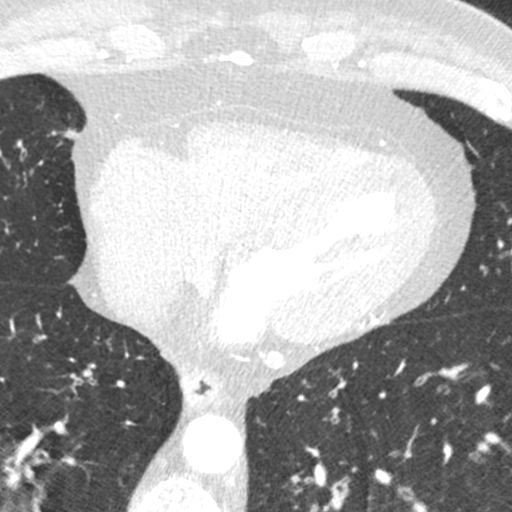
[im 222/333  lung]
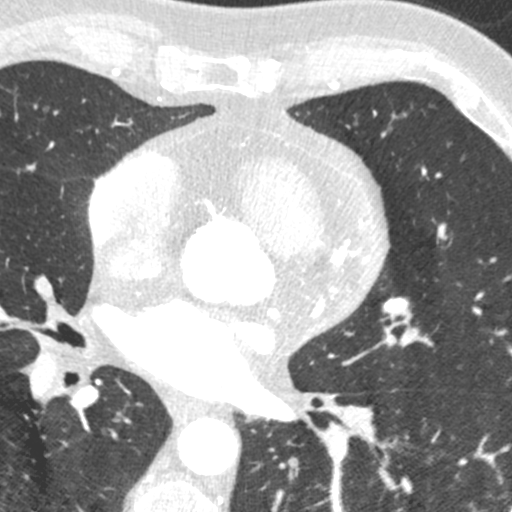

[Series 9: ts diast sharp · axial · 0.39mm/px · z∈[+1337,+1382]mm · 2 of 333 slices shown]
[im 111/333  lung]
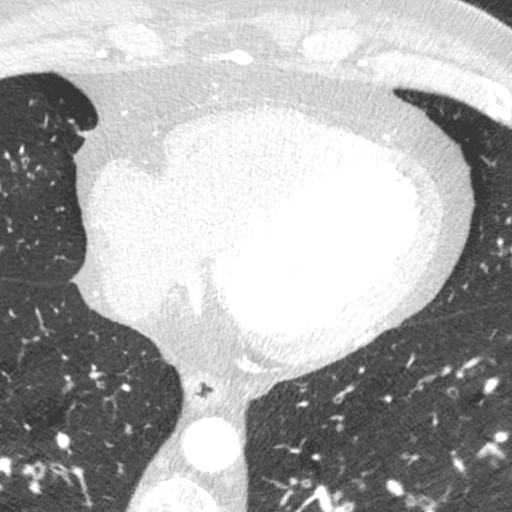
[im 222/333  lung]
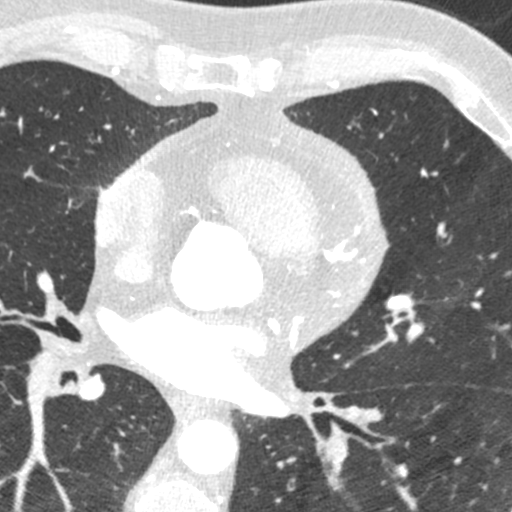

[8 of 20 positions shown; findings below may reference images not displayed]

FINDINGS: Vascular: Aortic atherosclerosis. No central pulmonary embolism, on
this non-dedicated study.

Mediastinum/Nodes: No imaged thoracic adenopathy.

Lungs/Pleura: No pleural fluid. Moderate basilar predominant
bronchial wall thickening.

Mild centrilobular emphysema. Subtle micronodularity, most likely
secondary to smoking related respiratory bronchiolitis.

Upper Abdomen: Hepatic steatosis. Normal imaged portions of the
spleen, stomach.

Musculoskeletal: No acute osseous abnormality.
IMPRESSION: 1.  No acute findings in the imaged extracardiac chest.
2. Aortic atherosclerosis (YLKEK-KCI.I) and emphysema (YLKEK-5HH.V).
3. Hepatic steatosis
FINDINGS: Non-cardiac: See separate report from [REDACTED]. No
significant findings on limited lung and soft tissue windows.

Calcium score: LM and 3 vessel calcium

LM:

LAD: 657

RCA:

LCX: 171

Total:  895

Coronary Arteries: Right dominant with no anomalies

LM: 1-24% distal calcified stenosis

LAD: 50-69% mixed plaque in proximal vessel 50-69% calcified plaque
in mid vessel 25-49% calcified plaque distally

D1: 70-99% mid vessel stenosis

D2: 50-69% calcified ostial plaque

Circumflex: 1-24% calcified plaque proximally 25-49% mixed plaque
mid vessel 25-49% calcified plaque distally

OM1: Small vessel not well seen

OM2: 25-49% calcified plaque

RCA: Proximal occlusion with likely distal vessel filling from
collaterals
IMPRESSION: 1. Calcium score 895 which is 98 th percentile for age/sex

2.  Normal aortic root diameter 3.0 cm

3. CAD RADS 5 severe CAD Occluded RCA and likely obstructive disease
in LAD Study sent for FFR CT

Yess Sias

*** End of Addendum ***
EXAM:
OVER-READ INTERPRETATION  CT CHEST

The following report is an over-read performed by radiologist Dr.
over-read does not include interpretation of cardiac or coronary
anatomy or pathology. The coronary CTA interpretation by the
cardiologist is attached.
FINDINGS: Vascular: Aortic atherosclerosis. No central pulmonary embolism, on
this non-dedicated study.

Mediastinum/Nodes: No imaged thoracic adenopathy.

Lungs/Pleura: No pleural fluid. Moderate basilar predominant
bronchial wall thickening.

Mild centrilobular emphysema. Subtle micronodularity, most likely
secondary to smoking related respiratory bronchiolitis.

Upper Abdomen: Hepatic steatosis. Normal imaged portions of the
spleen, stomach.

Musculoskeletal: No acute osseous abnormality.
IMPRESSION: 1.  No acute findings in the imaged extracardiac chest.
2. Aortic atherosclerosis (YLKEK-KCI.I) and emphysema (YLKEK-5HH.V).
3. Hepatic steatosis

## 2023-04-14 ENCOUNTER — Telehealth: Payer: Self-pay | Admitting: Pharmacist

## 2023-04-14 NOTE — Progress Notes (Signed)
04/14/2023  Patient ID: Bobby Gonzales, male   DOB: 07-11-1965, 57 y.o.   MRN: [id number]  Reason for referral: medication assistance  Referral source: [employer] Medication Assistance list Referral medication(s): Breztri Current insurance:[employer]  Objective: Allergies  Allergen Reactions   Cymbalta [Duloxetine Hcl] Nausea And Vomiting    Medications Reviewed Today     Reviewed by Beecher Mcardle, [facility] (Pharmacist) on 04/14/23 at [id number]  Med List Status: <None>   Medication Order Taking? Sig Documenting Provider Last Dose Status Informant  acetaminophen (TYLENOL) 650 MG CR tablet [id number] Yes Take 1,300 mg by mouth at bedtime. 1900 [provider] Taking Active Self  albuterol (VENTOLIN HFA) 108 (90 Base) MCG/ACT inhaler [id number] Yes Inhale 1-2 puffs into the lungs every 6 (six) hours as needed for wheezing or shortness of breath. [provider] Taking Active Self  ALPRAZolam Prudy Feeler) 1 MG tablet [id number] Yes Take 2 mg by mouth at bedtime. [provider] Taking Active Self  aspirin EC 81 MG tablet [id number] Yes Take 81 mg by mouth at bedtime. Swallow whole. [provider] Taking Active Self  Budeson-Glycopyrrol-Formoterol (BREZTRI AEROSPHERE) 160-9-4.8 MCG/ACT AERO [id number] Yes Inhale 2 puffs into the lungs daily. Take 2 puffs first thing in am and then another 2 puffs about 12 hours later.  Patient taking differently: Inhale 2 puffs into the lungs every 12 (twelve) hours.   Nyoka Cowden, MD Taking Active Self  clobetasol cream (TEMOVATE) 0.05 % [id number] Yes Apply 1 Application topically 2 (two) times daily as needed (rash). [provider] Taking Active   famotidine (PEPCID) 20 MG tablet [id number] Yes TAKE 1 TABLET BY MOUTH AFTER BREAKFAST AND AFTER SUPPER Nyoka Cowden, MD Taking Active   fenofibrate 160 MG tablet [id number] Yes Take 160 mg by mouth daily. [provider] Taking Active   furosemide (LASIX) 20 MG  tablet [id number] Yes Take 20 mg by mouth. [provider] Taking Active Self  isosorbide mononitrate (IMDUR) 60 MG 24 hr tablet [id number] Yes TAKE 1 TABLET(60 MG) BY MOUTH DAILY Strader, Lennart Pall, PA-C Taking Active   metoprolol tartrate (LOPRESSOR) 25 MG tablet [id number] Yes Take 1 tablet (25 mg total) by mouth 2 (two) times daily. Nyoka Cowden, MD Taking Active Self  omeprazole (PRILOSEC) 20 MG capsule [id number] Yes Take 20 mg by mouth daily as needed. [provider] Taking Active   rosuvastatin (CRESTOR) 40 MG tablet [id number] Yes Take 1 tablet (40 mg total) by mouth at bedtime. Jake Bathe, MD Taking Active   traZODone (DESYREL) 150 MG tablet [id number] Yes Take 150 mg by mouth at bedtime. [provider] Taking Active Self            Medication Assistance Findings:  Medication assistance needs identified: Spoke with Patient via telephone. He confirmed receiving Breztri through Patient Assistance Programs last year and said He would still need assistance this year.  He did receive a letter from [employer] about re-enrollment but said He did not have a computer nor did he feel comfortable with technology.  A new application will be sent.       Additional medication assistance options reviewed with patient as warranted:  No other options identified  Plan: I will route patient assistance letter to [facility] pharmacy technician who will coordinate patient assistance program application process for medications listed above.  [facility] pharmacy technician will assist with obtaining all required documents from both patient and provider(s) and submit application(s) once completed.  Beecher Mcardle, PharmD, BCACP [facility] Clinical Pharmacist [phone]

## 2023-04-22 ENCOUNTER — Telehealth: Payer: Self-pay | Admitting: Pharmacy Technician

## 2023-04-22 DIAGNOSIS — Z5986 Financial insecurity: Secondary | ICD-10-CM

## 2023-04-22 NOTE — Progress Notes (Signed)
[facility])                                            [facility] Quality Pharmacy Team    04/22/2023  Bobby Gonzales 1966/05/27 [id number]                                      Medication Assistance Referral  Referral From: [facility] Katina B.  Medication/Company: Bobby Gonzales / AZ[employer]ME Patient application portion:  Mailed Provider application portion: Faxed  to Dr. Nita Sells Provider address/fax verified via: Office website  Pattricia Boss, CPhT [place]  Office: [phone] Fax: [phone] Email: [email or web address]

## 2023-05-06 ENCOUNTER — Telehealth: Payer: Self-pay | Admitting: Pharmacy Technician

## 2023-05-06 DIAGNOSIS — Z5986 Financial insecurity: Secondary | ICD-10-CM

## 2023-05-06 NOTE — Progress Notes (Signed)
[facility])                                            [facility] Quality Pharmacy Team    05/06/2023  Bobby Gonzales Jul 26, 1965 [id number]  Received both patient and provider portion(s) of patient assistance application(s) for [facility]. Faxed completed application and required documents into [employer].  Pattricia Boss, CPhT [place]  Office: [phone] Fax: [phone] Email: [email or web address]

## 2023-05-10 ENCOUNTER — Telehealth: Payer: Self-pay | Admitting: Pharmacy Technician

## 2023-05-10 DIAGNOSIS — Z5986 Financial insecurity: Secondary | ICD-10-CM

## 2023-05-10 NOTE — Progress Notes (Signed)
[facility])                                            [facility] Quality Pharmacy Team    05/10/2023  Tinsley Lomas 1966-05-25 [id number]  Care coordination call placed to [employer] in regard to [facility] re enrollment.  Patient has been APPROVED 07/07/23-06/3124. Medication refills will be processed automatically and delivered to the patient's home. Patient may call [employer] at [phone] at any time to check on his refill status or delivery dates.  Pattricia Boss, CPhT [place]  Office: [phone] Fax: [phone] Email: [email or web address]

## 2023-05-11 ENCOUNTER — Ambulatory Visit (HOSPITAL_COMMUNITY)
Admission: RE | Admit: 2023-05-11 | Discharge: 2023-05-11 | Disposition: A | Discharge status: Home / Self Care | Payer: Medicare Other | Source: Ambulatory Visit | Attending: Internal Medicine | Admitting: Internal Medicine

## 2023-05-11 DIAGNOSIS — R911 Solitary pulmonary nodule: Secondary | ICD-10-CM | POA: Insufficient documentation

## 2023-05-11 DIAGNOSIS — I700 Atherosclerosis of aorta: Secondary | ICD-10-CM | POA: Diagnosis not present

## 2023-05-11 DIAGNOSIS — J432 Centrilobular emphysema: Secondary | ICD-10-CM | POA: Diagnosis not present

## 2023-05-14 ENCOUNTER — Encounter: Payer: Self-pay | Admitting: Internal Medicine

## 2023-05-14 ENCOUNTER — Ambulatory Visit: Payer: Medicare Other | Attending: Internal Medicine | Admitting: Internal Medicine

## 2023-05-14 VITALS — BP 110/64 | HR 60 | Ht 72.0 in | Wt 211.0 lb

## 2023-05-14 DIAGNOSIS — E7849 Other hyperlipidemia: Secondary | ICD-10-CM

## 2023-05-14 DIAGNOSIS — I25118 Atherosclerotic heart disease of native coronary artery with other forms of angina pectoris: Secondary | ICD-10-CM | POA: Diagnosis not present

## 2023-05-14 DIAGNOSIS — Z7902 Long term (current) use of antithrombotics/antiplatelets: Secondary | ICD-10-CM | POA: Insufficient documentation

## 2023-05-14 DIAGNOSIS — E785 Hyperlipidemia, unspecified: Secondary | ICD-10-CM | POA: Insufficient documentation

## 2023-05-14 MED ORDER — ICOSAPENT ETHYL 1 G PO CAPS: 2.0000 g | ORAL_CAPSULE | Freq: Two times a day (BID) | ORAL | 3 refills | Status: DC
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
## 2023-05-14 NOTE — Patient Instructions (Signed)
Medication Instructions:  Your physician has recommended you make the following change in your medication:   -Stop Fenofibrate -Start Vascepa 2g tablets twice daily.   *If you need a refill on your cardiac medications before your next appointment, please call your pharmacy*   Lab Work: None If you have labs (blood work) drawn today and your tests are completely normal, you will receive your results only by: MyChart Message (if you have MyChart) OR A paper copy in the mail If you have any lab test that is abnormal or we need to change your treatment, we will call you to review the results.   Testing/Procedures: None   Follow-Up: At [facility], you and your health needs are our priority.  As part of our continuing mission to provide you with exceptional heart care, we have created designated Provider Care Teams.  These Care Teams include your primary Cardiologist (physician) and Advanced Practice Providers (APPs -  Physician Assistants and Nurse Practitioners) who all work together to provide you with the care you need, when you need it.  We recommend signing up for the patient portal called "MyChart".  Sign up information is provided on this After Visit Summary.  MyChart is used to connect with patients for Virtual Visits (Telemedicine).  Patients are able to view lab/test results, encounter notes, upcoming appointments, etc.  Non-urgent messages can be sent to your provider as well.   To learn more about what you can do with MyChart, go to [email or web address].    Your next appointment:   1 year(s)  Provider:   You may see Vishnu P Mallipeddi, MD or one of the following Advanced Practice Providers on your designated Care Team:   [place], PA-C  Jacolyn Reedy, [place]     Other Instructions

## 2023-05-31 NOTE — Assessment & Plan Note (Signed)
Referred for [facility]  04/01/2022 >>> done 09/04/22 with 4AS  - Rec: repeat CT CHEST LCS NODULEFOLLOW-UP W/O CM  Discussed in detail all the  indications, usual  risks and alternatives  relative to the benefits with patient who agrees to proceed with w/u as outlined.        Each maintenance medication was reviewed in detail including emphasizing most importantly the difference between maintenance and prns and under what circumstances the prns are to be triggered using an action plan format where appropriate.  Total time for H and P, chart review, counseling, reviewing hfa device(s) and generating customized AVS unique to this office visit / same day charting = 22 min

## 2023-06-09 ENCOUNTER — Telehealth: Payer: Self-pay | Admitting: Internal Medicine

## 2023-06-09 MED ORDER — FENOFIBRATE 160 MG PO TABS: 160.0000 mg | ORAL_TABLET | Freq: Every day | ORAL | 3 refills | Status: DC

## 2023-06-09 NOTE — Telephone Encounter (Signed)
Spoke to patient who stated that since starting vascepa he is experiencing swollen, red, itchy legs and severe joint pain that has been worse. Pt stated that he wants to go back to taking Fenofibrate.   Spoke to RN, Marlyn Corporal, who advised that pt should stop Vascepa until provider has a chance to review note and make recommendations. Pt notified and verbalized understanding.    Please advise.

## 2023-06-09 NOTE — Telephone Encounter (Signed)
Pt c/o medication issue:  1. Name of Medication:   icosapent Ethyl (VASCEPA) 1 g capsule    2. How are you currently taking this medication (dosage and times per day)?   Take 2 capsules (2 g total) by mouth 2 (two) times daily.    3. Are you having a reaction (difficulty breathing--STAT)? No  4. What is your medication issue? Pt states that the above medication has started to cause his leg to swell. Please advise

## 2023-06-09 NOTE — Telephone Encounter (Signed)
Detailed message left on pt's vm- okay per DPR. Pt notified to d/c Vascepa and restart Fenofibrate at 160 mg once daily. Pt notified if he has any questions or concerns to call our office.   **Vascepa added to pt's allergy list per provider.**

## 2023-07-10 ENCOUNTER — Other Ambulatory Visit: Payer: Self-pay | Admitting: Student

## 2023-07-27 DIAGNOSIS — E782 Mixed hyperlipidemia: Secondary | ICD-10-CM | POA: Diagnosis not present

## 2023-07-27 DIAGNOSIS — R7303 Prediabetes: Secondary | ICD-10-CM | POA: Diagnosis not present

## 2023-08-03 DIAGNOSIS — G4700 Insomnia, unspecified: Secondary | ICD-10-CM | POA: Diagnosis not present

## 2023-08-03 DIAGNOSIS — J449 Chronic obstructive pulmonary disease, unspecified: Secondary | ICD-10-CM | POA: Diagnosis not present

## 2023-08-03 DIAGNOSIS — M5030 Other cervical disc degeneration, unspecified cervical region: Secondary | ICD-10-CM | POA: Diagnosis not present

## 2023-08-03 DIAGNOSIS — N1831 Chronic kidney disease, stage 3a: Secondary | ICD-10-CM | POA: Diagnosis not present

## 2023-08-03 DIAGNOSIS — Z0000 Encounter for general adult medical examination without abnormal findings: Secondary | ICD-10-CM | POA: Diagnosis not present

## 2023-08-03 DIAGNOSIS — E782 Mixed hyperlipidemia: Secondary | ICD-10-CM | POA: Diagnosis not present

## 2023-08-03 DIAGNOSIS — Z87891 Personal history of nicotine dependence: Secondary | ICD-10-CM | POA: Diagnosis not present

## 2023-08-03 DIAGNOSIS — R7303 Prediabetes: Secondary | ICD-10-CM | POA: Diagnosis not present

## 2023-08-03 DIAGNOSIS — K219 Gastro-esophageal reflux disease without esophagitis: Secondary | ICD-10-CM | POA: Diagnosis not present

## 2023-08-03 DIAGNOSIS — Z955 Presence of coronary angioplasty implant and graft: Secondary | ICD-10-CM | POA: Diagnosis not present

## 2023-08-03 DIAGNOSIS — I129 Hypertensive chronic kidney disease with stage 1 through stage 4 chronic kidney disease, or unspecified chronic kidney disease: Secondary | ICD-10-CM | POA: Diagnosis not present

## 2023-10-14 ENCOUNTER — Telehealth: Payer: Self-pay | Admitting: Student

## 2023-10-14 MED ORDER — ISOSORBIDE MONONITRATE ER 60 MG PO TB24: 60.0000 mg | ORAL_TABLET | Freq: Every day | ORAL | 3 refills | Status: DC

## 2023-10-14 NOTE — Telephone Encounter (Signed)
 Refill request completed.

## 2023-10-14 NOTE — Telephone Encounter (Signed)
*  STAT* If patient is at the pharmacy, call can be transferred to refill team.   1. Which medications need to be refilled? (please list name of each medication and dose if known) Isosorbide   2. Would you like to learn more about the convenience, safety, & potential cost savings by using the [facility]?      3. Are you open to using the [facility] (Type [facility].    4. Which pharmacy/location (including street and city if local pharmacy) is medication to be sent to? [facility] [address] [place]   5. Do they need a 30 day or 90 day supply? 90 days and refills

## 2024-01-01 ENCOUNTER — Other Ambulatory Visit: Payer: Self-pay | Admitting: Cardiology

## 2024-01-18 ENCOUNTER — Telehealth: Payer: Self-pay | Admitting: Internal Medicine

## 2024-01-18 MED ORDER — ROSUVASTATIN CALCIUM 40 MG PO TABS: 40.0000 mg | ORAL_TABLET | Freq: Every day | ORAL | 1 refills | Status: DC

## 2024-01-18 NOTE — Telephone Encounter (Signed)
 Pt's medication was sent to pt's pharmacy as requested. Confirmation received.

## 2024-01-18 NOTE — Telephone Encounter (Signed)
*  STAT* If patient is at the pharmacy, call can be transferred to refill team.   1. Which medications need to be refilled? (please list name of each medication and dose if known)   rosuvastatin  (CRESTOR ) 40 MG tablet     4. Which pharmacy/location (including street and city if local pharmacy) is medication to be sent to? [id number] - [place], [place] - [address] AT [facility] [address]     5. Do they need a 30 day or 90 day supply? 90

## 2024-01-25 DIAGNOSIS — E782 Mixed hyperlipidemia: Secondary | ICD-10-CM | POA: Diagnosis not present

## 2024-01-25 DIAGNOSIS — R7303 Prediabetes: Secondary | ICD-10-CM | POA: Diagnosis not present

## 2024-01-31 DIAGNOSIS — N1831 Chronic kidney disease, stage 3a: Secondary | ICD-10-CM | POA: Diagnosis not present

## 2024-01-31 DIAGNOSIS — I129 Hypertensive chronic kidney disease with stage 1 through stage 4 chronic kidney disease, or unspecified chronic kidney disease: Secondary | ICD-10-CM | POA: Diagnosis not present

## 2024-01-31 DIAGNOSIS — M5030 Other cervical disc degeneration, unspecified cervical region: Secondary | ICD-10-CM | POA: Diagnosis not present

## 2024-01-31 DIAGNOSIS — M79604 Pain in right leg: Secondary | ICD-10-CM | POA: Diagnosis not present

## 2024-01-31 DIAGNOSIS — I10 Essential (primary) hypertension: Secondary | ICD-10-CM | POA: Diagnosis not present

## 2024-01-31 DIAGNOSIS — R7303 Prediabetes: Secondary | ICD-10-CM | POA: Diagnosis not present

## 2024-01-31 DIAGNOSIS — K219 Gastro-esophageal reflux disease without esophagitis: Secondary | ICD-10-CM | POA: Diagnosis not present

## 2024-01-31 DIAGNOSIS — Z955 Presence of coronary angioplasty implant and graft: Secondary | ICD-10-CM | POA: Diagnosis not present

## 2024-01-31 DIAGNOSIS — J449 Chronic obstructive pulmonary disease, unspecified: Secondary | ICD-10-CM | POA: Diagnosis not present

## 2024-01-31 DIAGNOSIS — D631 Anemia in chronic kidney disease: Secondary | ICD-10-CM | POA: Diagnosis not present

## 2024-01-31 DIAGNOSIS — E782 Mixed hyperlipidemia: Secondary | ICD-10-CM | POA: Diagnosis not present

## 2024-03-27 ENCOUNTER — Encounter: Payer: Self-pay | Admitting: Internal Medicine

## 2024-03-27 NOTE — Telephone Encounter (Signed)
 error

## 2024-04-10 ENCOUNTER — Encounter: Payer: Self-pay | Admitting: Internal Medicine

## 2024-04-10 ENCOUNTER — Ambulatory Visit: Admitting: Internal Medicine

## 2024-04-10 VITALS — BP 103/67 | HR 61 | Ht 72.0 in | Wt 215.0 lb

## 2024-04-10 DIAGNOSIS — R911 Solitary pulmonary nodule: Secondary | ICD-10-CM

## 2024-04-10 DIAGNOSIS — Z87891 Personal history of nicotine dependence: Secondary | ICD-10-CM | POA: Diagnosis not present

## 2024-04-10 DIAGNOSIS — J449 Chronic obstructive pulmonary disease, unspecified: Secondary | ICD-10-CM | POA: Diagnosis not present

## 2024-04-10 NOTE — Assessment & Plan Note (Addendum)
 Quit smoking 07/2019  - PFT's  02/27/20  FEV1 2.55 (63 % ) ratio 0.62  p 6 % improvement from saba p ? prior to study with DLCO  24.81 (81%) corrects to 3.46 (80 %)  for alv volume and FV curve classically concave  - 08/15/2020  After extensive coaching inhaler device,  effectiveness =    85% > try breztri  2bid samples x 2 weeks then trelegy x 2 weeks then return for re-eval - 09/10/2020  After extensive coaching inhaler device,  effectiveness =    95%  Not limited by doe nor having aecopd on Breztri  for GOLD 2/ group E symptom/ risk > no change in Rx needed  CP classic IBS pattern > rx diet reviewe/ add citrucel next if persist

## 2024-04-10 NOTE — Progress Notes (Signed)
 Bobby Gonzales, male    DOB: 10-06-1965    MRN: [id number]   Brief patient profile:  57  yowm quit smoking 07/2019 @ wt 196  with onset of doe @ 2009 assoc wheeze >  Better on advair but wore off after a few years and then changed to breztri  but no better so referred to pulmonary clinic in [facility]  08/15/2020 by Dr  [place] office with GOLD II criteria 02/2020     History of Present Illness  08/15/2020  Pulmonary/ 1st office eval/ [place] / [facility] re GOLD II copd  Chief Complaint  Patient presents with   Consult    Shortness of breath with activity since July 2021  Dyspnea:  MMRC2 = can't walk a nl pace on a flat grade s sob but does fine slow and flat  - legs often stop him first/ prednisone always helps Cough: at times  Sleep: on side / bed is flat  SABA use: saba  Up to 3-4 x per day p ex/ neb bid  rec Plan A = Automatic = Always=  Breztri  Take 2 puffs first thing in am and then another 2 puffs about 12 hours later.  Work on inhaler technique:   After you use up the breztri ,  Change to Trelegy one click each am Depomedrol 120 mg IM today  Plan B = Backup (to supplement plan A, not to replace it) Only use your albuterol  inhaler as a rescue medication  Plan C = Crisis (instead of Plan B but only if Plan B stops working) - only use your albuterol  nebulizer (8 x stronger) if you first try Plan B and it fails to help > ok to use the nebulizer up to every 4 hours but if start needing it regularly call for immediate appointment    03/26/2021  f/u ov/[place] office/Zuhair Lariccia re: GOLD  2 COPD maint on breztri  2 bid   Chief Complaint  Patient presents with   Follow-up    Pt says he has noticed pain around his collar bone and center of chest since last visit. No sob or cough to report   Dyspnea:  10 min medium pace/ slowed by hips  Cp with weed eating x 3 weeks x 10 min s nausea/ diphoresis or radiation on pepcid  20 mg bid  Cough: none  Sleeping: bed is flat, 2 pillows  SABA use:  you told me not to use it / no neb at all/ rarely saba 02: none  Covid status: never vax / original infection  Rec Lopressor  25 mg twice daily until you see a heart doctor and continue your aspirin  daily  We will be referring you to [facility] at [facility]  - go to ER if chest pain does not go away at rest or happens with less and less exertion.  Plan A = Automatic = Always=  Breztri  Take 2 puffs first thing in am and then another 2 puffs about 12 hours later.  Plan B = Backup (to supplement plan A, not to replace it) Only use your albuterol  inhaler as a rescue medication Plan C = Crisis (instead of Plan B but only if Plan B stops working) - only use your albuterol  nebulizer (8 x stronger) if you first try Plan B        04/10/2024  f/u ov/ office/Jishnu Jenniges re: GOLD 2  COPD  maint on Breztri    Chief Complaint  Patient presents with   COPD    F/u  Dyspnea:  neck shoulder hips limiting more than breathing / can't bend over due to hip pain Cough: min mucoid  Sleeping: bed is flat / 2 pillows s  resp cc  SABA use: once or twice a day to help get mucus out  02: none  Ant migrating fleeting Cp over a year, 5 sec and gone / no relation not present supine   Lung cancer screening: referred back to LCS program today    No obvious day to day or daytime variability or assoc excess/ purulent sputum or mucus plugs or hemoptysis or cp or chest tightness, subjective wheeze or overt sinus or hb symptoms.    Also denies any obvious fluctuation of symptoms with weather or environmental changes or other aggravating or alleviating factors except as outlined above   No unusual exposure hx or h/o childhood pna/ asthma or knowledge of premature birth.  Current Allergies, Complete Past Medical History, Past Surgical History, Family History, and Social History were reviewed in [employer] record.  ROS  The following are not active complaints unless  bolded Hoarseness, sore throat, dysphagia, dental problems, itching, sneezing,  nasal congestion or discharge of excess mucus or purulent secretions, ear ache,   fever, chills, sweats, unintended wt loss or wt gain, classically pleuritic or exertional cp,  orthopnea pnd or arm/hand swelling  or leg swelling, presyncope, palpitations, abdominal pain, anorexia, nausea, vomiting, diarrhea  or change in bowel habits or change in bladder habits, change in stools or change in urine, dysuria, hematuria,  rash, arthralgias, visual complaints, headache, numbness, weakness or ataxia or problems with walking or coordination,  change in mood or  memory.        Current Meds  Medication Sig   acetaminophen  (TYLENOL ) 650 MG CR tablet Take 1,300 mg by mouth at bedtime. 1900   albuterol  (VENTOLIN  HFA) 108 (90 Base) MCG/ACT inhaler Inhale 1-2 puffs into the lungs every 6 (six) hours as needed for wheezing or shortness of breath.   ALPRAZolam  (XANAX ) 1 MG tablet Take 2 mg by mouth at bedtime.   aspirin  EC 81 MG tablet Take 81 mg by mouth at bedtime. Swallow whole.   Budeson-Glycopyrrol-Formoterol  (BREZTRI  AEROSPHERE) 160-9-4.8 MCG/ACT AERO Inhale 2 puffs into the lungs daily. Take 2 puffs first thing in am and then another 2 puffs about 12 hours later. (Patient taking differently: Inhale 2 puffs into the lungs every 12 (twelve) hours.)   clobetasol cream (TEMOVATE) 0.05 % Apply 1 Application topically 2 (two) times daily as needed (rash).   famotidine  (PEPCID ) 20 MG tablet TAKE 1 TABLET BY MOUTH AFTER BREAKFAST AND AFTER SUPPER   fenofibrate  160 MG tablet Take 1 tablet (160 mg total) by mouth daily.   furosemide  (LASIX ) 20 MG tablet Take 20 mg by mouth.   isosorbide  mononitrate (IMDUR ) 60 MG 24 hr tablet Take 1 tablet (60 mg total) by mouth daily.   metoprolol  tartrate (LOPRESSOR ) 25 MG tablet Take 1 tablet (25 mg total) by mouth 2 (two) times daily.   omeprazole (PRILOSEC) 20 MG capsule Take 20 mg by mouth daily as  needed.   rosuvastatin  (CRESTOR ) 40 MG tablet Take 1 tablet (40 mg total) by mouth at bedtime.   traZODone  (DESYREL ) 150 MG tablet Take 150 mg by mouth at bedtime.                 Past Medical History:  Diagnosis Date   Anxiety    COPD (chronic obstructive pulmonary disease) (HCC)  Depression    Hypercholesterolemia    Insomnia    Jock itch        Objective:    Wts  04/10/2024        215  04/01/2023        211  04/01/2022       207   03/26/2021      218   09/10/20 215 lb (97.5 kg)  08/15/20 214 lb (97.1 kg)  09/15/17 216 lb (98 kg)    Vital signs reviewed  04/10/2024  - Note at rest 02 sats  95% on RA   General appearance:    somber amb wm nad   HEENT : Oropharynx  clear   Nasal turbinates nl    NECK :  without  apparent JVD/ palpable Nodes/TM    LUNGS: no acc muscle use,  Mild barrel  contour chest wall with bilateral  Distant bs s audible wheeze and  without cough on insp or exp maneuvers  and mild  Hyperresonant  to  percussion bilaterally     CV:  RRR  no s3 or murmur or increase in P2, and no edema   ABD:  soft and nontender   MS:  Nl gait/ ext warm without deformities Or obvious joint restrictions  calf tenderness, cyanosis or clubbing     SKIN: warm and dry without lesions    NEURO:  alert, approp, nl sensorium with  no motor or cerebellar deficits apparent.         Assessment    Assessment & Plan COPD GOLD II Quit smoking 07/2019  - PFT's  02/27/20  FEV1 2.55 (63 % ) ratio 0.62  p 6 % improvement from saba p ? prior to study with DLCO  24.81 (81%) corrects to 3.46 (80 %)  for alv volume and FV curve classically concave  - 08/15/2020  After extensive coaching inhaler device,  effectiveness =    85% > try breztri  2bid samples x 2 weeks then trelegy x 2 weeks then return for re-eval - 09/10/2020  After extensive coaching inhaler device,  effectiveness =    95%  Not limited by doe nor having aecopd on Breztri  for GOLD 2/ group E symptom/ risk > no  change in Rx needed  CP classic IBS pattern > rx diet reviewe/ add citrucel next if persist  Former cigarette smoker Quit smokiing 2021 Referred for LDSCT  04/01/2022 >>> done 09/04/22 with 4AS  > repeat CT chest  05/11/23 : 1. The dominant posterior right upper lobe pulmonary nodule on screening CT has resolved. 2. Right middle and left lower lobe peribronchovascular ground-glass, suspicious for mild infection  3. Aortic atherosclerosis (ICD10-I70.0), coronary artery atherosclerosis and emphysema (ICD10-J43.9). 4. Hepatic steatosis Rec resume yearly LDSCT  thru 2036 per guidelines   Referred back to LCS today per pt request.  Discussed in detail all the  indications, usual  risks and alternatives  relative to the benefits with patient who agrees to proceed with w/u as outlined.            Each maintenance medication was reviewed in detail including emphasizing most importantly the difference between maintenance and prns and under what circumstances the prns are to be triggered using an action plan format where appropriate.  Total time for H and P, chart review, counseling, reviewing hfa  device(s) and generating customized AVS unique to this office visit / same day charting = 25 min  AVS  Patient Instructions  Avoid foods that cause gas   My office will be contacting you by phone for referral to lung cancer screening   [phone]) - if you don't hear back from my office within one week,  please call us  back or notify us  thru MyChart and we'll address it right away.     Please schedule a follow up visit in 12  months but call sooner if needed    Ozell America, MD 04/10/2024

## 2024-04-10 NOTE — Assessment & Plan Note (Addendum)
 Quit smokiing 2021 Referred for LDSCT  04/01/2022 >>> done 09/04/22 with 4AS  > repeat CT chest  05/11/23 : 1. The dominant posterior right upper lobe pulmonary nodule on screening CT has resolved. 2. Right middle and left lower lobe peribronchovascular ground-glass, suspicious for mild infection  3. Aortic atherosclerosis (ICD10-I70.0), coronary artery atherosclerosis and emphysema (ICD10-J43.9). 4. Hepatic steatosis Rec resume yearly LDSCT  thru 2036 per guidelines   Referred back to LCS today per pt request.  Discussed in detail all the  indications, usual  risks and alternatives  relative to the benefits with patient who agrees to proceed with w/u as outlined.            Each maintenance medication was reviewed in detail including emphasizing most importantly the difference between maintenance and prns and under what circumstances the prns are to be triggered using an action plan format where appropriate.  Total time for H and P, chart review, counseling, reviewing hfa  device(s) and generating customized AVS unique to this office visit / same day charting = 25 min

## 2024-04-10 NOTE — Patient Instructions (Signed)
 Avoid foods that cause gas   My office will be contacting you by phone for referral to lung cancer screening   [phone]) - if you don't hear back from my office within one week,  please call us  back or notify us  thru MyChart and we'll address it right away.     Please schedule a follow up visit in 12  months but call sooner if needed

## 2024-04-25 ENCOUNTER — Telehealth: Payer: Self-pay

## 2024-04-25 DIAGNOSIS — Z122 Encounter for screening for malignant neoplasm of respiratory organs: Secondary | ICD-10-CM

## 2024-04-25 DIAGNOSIS — Z87891 Personal history of nicotine dependence: Secondary | ICD-10-CM

## 2024-04-25 DIAGNOSIS — F17210 Nicotine dependence, cigarettes, uncomplicated: Secondary | ICD-10-CM

## 2024-04-25 NOTE — Telephone Encounter (Signed)
 Lung Cancer Screening Narrative/Criteria Questionnaire (Cigarette Smokers Only- No Cigars/Pipes/vapes)   Bobby Gonzales   SDMV:05/02/2024 at 10:00 am [name]     [birth year]               LDCT: 05/09/2024 at 2:30 pm AP    58 y.o.   Phone: [phone]  Lung Screening Narrative (confirm age 20-77 yrs Medicare / 50-80 yrs Private pay insurance)   Insurance information: [facility] Medicare   Referring Provider:Wert, MD   This screening involves an initial phone call with a team member from our program. It is called a shared decision making visit. The initial meeting is required by  insurance and Medicare to make sure you understand the program. This appointment takes about 15-20 minutes to complete. You will complete the screening scan at your scheduled date/time.  This scan takes about 5-10 minutes to complete. You can eat and drink normally before and after the scan.  Criteria questions for Lung Cancer Screening:   Are you a current or former smoker? Former Age began smoking: 12   If you are a former smoker, what year did you quit smoking? Quit 2020 (within 15 yrs)   To calculate your smoking history, I need an accurate estimate of how many packs of cigarettes you smoked per day and for how many years. (Not just the number of PPD you are now smoking)   Years smoking 41 x Packs per day 3 = Pack years 123   (at least 20 pack yrs)   (Make sure they understand that we need to know how much they have smoked in the past, not just the number of PPD they are smoking now)  Do you have a personal history of cancer?  No    Do you have a family history of cancer? No  Are you coughing up blood?  No  Have you had unexplained weight loss of 15 lbs or more in the last 6 months? No  It looks like you meet all criteria.  When would be a good time for us  to schedule you for this screening?   Additional information: N/A

## 2024-05-02 NOTE — Progress Notes (Unsigned)
  Virtual Visit via Telephone Note  I connected with Bobby Gonzales , 05/03/24 10:11 AM by a telemedicine application and verified that I am speaking with the correct person using two identifiers.  Location: Patient: home Provider: home   I discussed the limitations of evaluation and management by telemedicine and the availability of in person appointments. The patient expressed understanding and agreed to proceed.   Shared Decision Making Visit Lung Cancer Screening Program [phone])   Eligibility: 58 y.o. Pack Years Smoking History Calculation = 123 pack years  (# packs/per year x # years smoked) Recent History of coughing up blood  no Unexplained weight loss? no ( >Than 15 pounds within the last 6 months ) Prior History Lung / other cancer no (Diagnosis within the last 5 years already requiring surveillance chest CT Scans). Smoking Status Former Smoker Former Smokers: Years since quit: 5 years  Quit Date: 2020  Visit Components: Discussion included one or more decision making aids. YES Discussion included risk/benefits of screening. YES Discussion included potential follow up diagnostic testing for abnormal scans. YES Discussion included meaning and risk of over diagnosis. YES Discussion included meaning and risk of False Positives. YES Discussion included meaning of total radiation exposure. YES  Counseling Included: Importance of adherence to annual lung cancer LDCT screening. YES Impact of comorbidities on ability to participate in the program. YES Ability and willingness to under diagnostic treatment. YES  Smoking Cessation Counseling: Former Smokers:  Discussed the importance of maintaining cigarette abstinence. yes Diagnosis Code: Personal History of Nicotine Dependence. S12.108 Information about tobacco cessation classes and interventions provided to patient. Yes Patient provided with ticket for LDCT Scan. yes Written Order for Lung Cancer Screening with LDCT  placed in Epic. Yes (CT Chest Lung Cancer Screening Low Dose W/O CM) [id number]  Z12.2-Screening of respiratory organs Z87.891-Personal history of nicotine dependence   Bobby Gonzales 05/03/24

## 2024-05-02 NOTE — Patient Instructions (Signed)

## 2024-05-03 ENCOUNTER — Encounter: Payer: Self-pay | Admitting: Adult Health

## 2024-05-03 ENCOUNTER — Ambulatory Visit: Admitting: Adult Health

## 2024-05-03 DIAGNOSIS — Z87891 Personal history of nicotine dependence: Secondary | ICD-10-CM

## 2024-05-09 ENCOUNTER — Ambulatory Visit (HOSPITAL_COMMUNITY)
Admission: RE | Admit: 2024-05-09 | Discharge: 2024-05-09 | Disposition: A | Discharge status: Home / Self Care | Source: Ambulatory Visit | Attending: Internal Medicine | Admitting: Internal Medicine

## 2024-05-09 DIAGNOSIS — Z122 Encounter for screening for malignant neoplasm of respiratory organs: Secondary | ICD-10-CM | POA: Insufficient documentation

## 2024-05-09 DIAGNOSIS — Z87891 Personal history of nicotine dependence: Secondary | ICD-10-CM | POA: Insufficient documentation

## 2024-05-09 DIAGNOSIS — F17210 Nicotine dependence, cigarettes, uncomplicated: Secondary | ICD-10-CM | POA: Diagnosis present

## 2024-05-16 ENCOUNTER — Other Ambulatory Visit: Payer: Self-pay

## 2024-05-16 DIAGNOSIS — F17210 Nicotine dependence, cigarettes, uncomplicated: Secondary | ICD-10-CM

## 2024-05-16 DIAGNOSIS — Z87891 Personal history of nicotine dependence: Secondary | ICD-10-CM

## 2024-05-16 DIAGNOSIS — Z122 Encounter for screening for malignant neoplasm of respiratory organs: Secondary | ICD-10-CM

## 2024-06-28 ENCOUNTER — Other Ambulatory Visit: Payer: Self-pay | Admitting: Internal Medicine

## 2024-07-07 ENCOUNTER — Other Ambulatory Visit: Payer: Self-pay | Admitting: Internal Medicine

## 2024-07-07 ENCOUNTER — Other Ambulatory Visit: Payer: Self-pay

## 2024-07-07 MED ORDER — FENOFIBRATE 160 MG PO TABS: 160.0000 mg | ORAL_TABLET | Freq: Every day | ORAL | 0 refills | Status: DC

## 2024-08-03 NOTE — Progress Notes (Signed)
 "  Cardiology Office Note    Date:  08/04/2024  ID:  Bobby Gonzales, DOB 01-Jul-1966, MRN [id number] Cardiologist: Diannah SHAUNNA Maywood, MD Cardiology APP:  Johnson Laymon HERO, PA-C { :  History of Present Illness:    Bobby Gonzales is a 59 y.o. male with past medical history of CAD (s/p DES x2 to proximal and mid LAD and noted to have CTO of RCA with R-->R collaterals by cath in 04/2021), HTN, HLD, Stage 3 CKD and COPD who presents to the office today for annual follow-up.  He was last examined by Dr. Maywood in 05/2023 and his biggest issue at that time was significant arthritis along his entire body. He denied any specific anginal symptoms. Triglycerides were elevated and Fenofibrate  was transitioned to Vascepa  2 g twice daily. He was continued on ASA 81 mg daily, Lasix  20 mg daily, Imdur  60 mg daily, Lopressor  25 mg twice daily and Crestor  40 mg daily.  In talking with the patient today, he reports things have been stable since his last office visit. He does have baseline dyspnea on exertion in the setting of prior tobacco use but denies any acute changes in this. No recent chest pain or palpitations. He does report having occasional numbness along his right forearm and this typically occurs in the setting of sexual intercourse but no other exertional activities. He is unsure if this is related to certain positional changes at that time. Does report having known arthritis. He denies any specific orthopnea or PND. Does have occasional lower extremity edema but mostly along his right lower extremity. No persistent symptoms. No longer takes Lasix  as he was frustrated with the frequent urination he experienced with this. No longer on Vascepa  as he reports the pills were too large to swallow.  Studies Reviewed:   EKG: EKG is ordered today and demonstrates:   EKG Interpretation Date/Time:  Friday August 04 2024 14:04:11 EST Ventricular Rate:  61 PR Interval:  152 QRS Duration:  82 QT  Interval:  402 QTC Calculation: 404 R Axis:   94  Text Interpretation: Normal sinus rhythm Rightward axis When compared with ECG of 14-May-2023 10:38, No significant change was found Confirmed by Johnson Laymon ([id number]) on 08/04/2024 2:12:19 PM       Cardiac Catheterization: 04/2021   Prox RCA lesion is 100% stenosed.   Mid LAD lesion is 80% stenosed.   Non-stenotic Prox LAD lesion.   A drug-eluting stent was successfully placed using a STENT ONYX FRONTIER 3.0X26.   A stent was successfully placed.   A drug-eluting stent was successfully placed using a STENT ONYX FRONTIER 4.0X18.   Post intervention, there is a 0% residual stenosis.   Post intervention, there is a 0% residual stenosis.   LV end diastolic pressure is severely elevated.   1.  Chronic total occlusion of the right coronary artery with right to right bridging collaterals 2.  Highly calcified mid LAD lesion treated with 2 overlapping drug-eluting stents in conjunction with shockwave lithotripsy.  The procedure was complicated by a perforation that was successfully treated with the PK papyrus covered stent. 3.  Highly elevated LVEDP.     Recommendations: We will monitor overnight with limited echocardiogram in the morning.  Patient should continue DAPT for at least 6 months.  Limited Echo: 04/19/2021 IMPRESSIONS     1. Trivial pericardial effusion.   2. Left ventricular ejection fraction, by estimation, is 55 to 60%. The  left ventricle has normal function. The left ventricle demonstrates  regional  wall motion abnormalities (see scoring diagram/findings for  description).   3. Right ventricular systolic function is normal. The right ventricular  size is normal.   4. The inferior vena cava is normal in size with greater than 50%  respiratory variability, suggesting right atrial pressure of 3 mmHg  Physical Exam:   VS:  BP 108/60 (BP Location: Left Arm, Cuff Size: Normal)   Pulse 63   Ht 6' (1.829 m)   Wt 221 lb  6.4 oz (100.4 kg)   SpO2 94%   BMI 30.03 kg/m    Wt Readings from Last 3 Encounters:  08/04/24 221 lb 6.4 oz (100.4 kg)  04/10/24 215 lb (97.5 kg)  05/14/23 211 lb (95.7 kg)     GEN: Well nourished, well developed male appearing in no acute distress NECK: No JVD; No carotid bruits CARDIAC: RRR, no murmurs, rubs, gallops RESPIRATORY:  Clear to auscultation without rales, wheezing or rhonchi  ABDOMEN: Appears non-distended. No obvious abdominal masses. EXTREMITIES: No clubbing or cyanosis. No pitting edema.  Distal pedal pulses are 2+ bilaterally.   Assessment and Plan:   1. Coronary artery disease involving native coronary artery of native heart with other form of angina pectoris - He previously underwent DES x2 to proximal and mid-LAD and noted to have CTO of RCA with R-->R collaterals by cardiac catheterization in 04/2021.  - He denies any recent anginal symptoms. I suspect the numbness along his right forearm is due to radiculopathy and encouraged to follow-up with his PCP initially and then he may benefit from referral to Orthopedics. We reviewed warning signs to monitor for which would warrant further cardiac evaluation. - Continue current medical therapy with ASA 81 mg daily, Imdur  60 mg daily, Lopressor  25mg  BID and Crestor  40 mg daily.  2. Essential hypertension - BP is well-controlled at 108/60 during today's visit. Continue current medical therapy with Imdur  60 mg daily and Lopressor  25 mg twice daily.  3. Hyperlipidemia LDL goal <70 - FLP in 05/2024 showed total cholesterol 128, triglycerides 239, HDL 25 and LDL 64. AST 16 and ALT 10. Continue current medical therapy with Crestor  40 mg daily and fenofibrate  160 mg daily. Intolerant to Vascepa  given the pill size. He has made dietary changes in the interim and is trying to limit his intake of fried food.  Signed, Laymon CHRISTELLA Qua, PA-C   "

## 2024-08-04 ENCOUNTER — Ambulatory Visit: Attending: Student | Admitting: Student

## 2024-08-04 ENCOUNTER — Encounter: Payer: Self-pay | Admitting: Student

## 2024-08-04 VITALS — BP 108/60 | HR 63 | Ht 72.0 in | Wt 221.4 lb

## 2024-08-04 DIAGNOSIS — I25118 Atherosclerotic heart disease of native coronary artery with other forms of angina pectoris: Secondary | ICD-10-CM

## 2024-08-04 DIAGNOSIS — I10 Essential (primary) hypertension: Secondary | ICD-10-CM | POA: Diagnosis not present

## 2024-08-04 DIAGNOSIS — E785 Hyperlipidemia, unspecified: Secondary | ICD-10-CM | POA: Diagnosis not present

## 2024-08-04 MED ORDER — ISOSORBIDE MONONITRATE ER 60 MG PO TB24: 60.0000 mg | ORAL_TABLET | Freq: Every day | ORAL | 3 refills | Status: AC

## 2024-08-04 MED ORDER — ROSUVASTATIN CALCIUM 40 MG PO TABS: 40.0000 mg | ORAL_TABLET | Freq: Every day | ORAL | 3 refills | Status: AC

## 2024-08-04 MED ORDER — METOPROLOL TARTRATE 25 MG PO TABS: 25.0000 mg | ORAL_TABLET | Freq: Two times a day (BID) | ORAL | 3 refills | Status: AC

## 2024-08-04 NOTE — Patient Instructions (Signed)
 Medication Instructions:  Your physician recommends that you continue on your current medications as directed. Please refer to the Current Medication list given to you today.  *If you need a refill on your cardiac medications before your next appointment, please call your pharmacy*  Lab Work: NONE   If you have labs (blood work) drawn today and your tests are completely normal, you will receive your results only by: MyChart Message (if you have MyChart) OR A paper copy in the mail If you have any lab test that is abnormal or we need to change your treatment, we will call you to review the results.  Testing/Procedures: NONE   Follow-Up: At [facility], you and your health needs are our priority.  As part of our continuing mission to provide you with exceptional heart care, our providers are all part of one team.  This team includes your primary Cardiologist (physician) and Advanced Practice Providers or APPs (Physician Assistants and Nurse Practitioners) who all work together to provide you with the care you need, when you need it.  Your next appointment:   1 year(s)  Provider:   You may see Vishnu P Mallipeddi, MD or one of the following Advanced Practice Providers on your designated Care Team:   [place], PA-C  [place], [place] Theotis Flake, [place]     We recommend signing up for the patient portal called "MyChart".  Sign up information is provided on this After Visit Summary.  MyChart is used to connect with patients for Virtual Visits (Telemedicine).  Patients are able to view lab/test results, encounter notes, upcoming appointments, etc.  Non-urgent messages can be sent to your provider as well.   To learn more about what you can do with MyChart, go to [email or web address].   Other Instructions Thank you for choosing [facility]!

## 2024-08-06 ENCOUNTER — Other Ambulatory Visit: Payer: Self-pay | Admitting: Internal Medicine

## 2024-08-09 NOTE — Telephone Encounter (Signed)
 Pt had labs and lipid panel done on 05/25/2024 within 365 days within normal range
